# Patient Record
Sex: Male | Born: 1935 | Race: White | Hispanic: No | Marital: Single | Smoking: Former smoker
Health system: Southern US, Community
[De-identification: ages and names within clinical notes are randomized; demographics above are authoritative.]

## PROBLEM LIST (undated history)

## (undated) DIAGNOSIS — Z950 Presence of cardiac pacemaker: Secondary | ICD-10-CM

## (undated) DIAGNOSIS — R0600 Dyspnea, unspecified: Secondary | ICD-10-CM

## (undated) DIAGNOSIS — J449 Chronic obstructive pulmonary disease, unspecified: Secondary | ICD-10-CM

## (undated) HISTORY — PX: INSERT / REPLACE / REMOVE PACEMAKER: SUR710

---

## 2014-07-05 ENCOUNTER — Inpatient Hospital Stay
Admit: 2014-07-05 | Disposition: A | Discharge status: Home / Self Care | Payer: Self-pay | Attending: Internal Medicine | Admitting: Internal Medicine

## 2014-07-05 LAB — COMPREHENSIVE METABOLIC PANEL WITH GFR
Albumin: 3.7 g/dL
Alkaline Phosphatase: 63 U/L
Anion Gap: 11
BUN: 23 mg/dL — ABNORMAL HIGH
Bilirubin,Total: 1.4 mg/dL — ABNORMAL HIGH
Calcium, Total: 8.3 mg/dL — ABNORMAL LOW
Chloride: 97 mmol/L — ABNORMAL LOW
Co2: 25 mmol/L
Creatinine: 1.34 mg/dL — ABNORMAL HIGH
EGFR (African American): 59 — ABNORMAL LOW
EGFR (Non-African Amer.): 51 — ABNORMAL LOW
Glucose: 109 mg/dL — ABNORMAL HIGH
Potassium: 3.5 mmol/L
SGOT(AST): 54 U/L — ABNORMAL HIGH
SGPT (ALT): 32 U/L
Sodium: 133 mmol/L — ABNORMAL LOW
Total Protein: 6.8 g/dL

## 2014-07-05 LAB — TROPONIN I
Troponin-I: 0.17 ng/mL — ABNORMAL HIGH
Troponin-I: 0.15 ng/mL — ABNORMAL HIGH
Troponin-I: 0.13 ng/mL — ABNORMAL HIGH

## 2014-07-05 LAB — CK-MB
CK-MB: 3.6 ng/mL
CK-MB: 3.8 ng/mL
CK-MB: 5.2 ng/mL — ABNORMAL HIGH

## 2014-07-05 LAB — CBC
HCT: 40.2 %
HGB: 13.2 g/dL
MCH: 31.5 pg
MCHC: 32.8 g/dL
MCV: 96 fL
Platelet: 108 x10 3/mm 3 — ABNORMAL LOW
RBC: 4.19 x10 6/mm 3 — ABNORMAL LOW
RDW: 14.6 % — ABNORMAL HIGH
WBC: 9.3 x10 3/mm 3

## 2014-07-05 LAB — PRO B NATRIURETIC PEPTIDE: B-Type Natriuretic Peptide: 415 pg/mL — ABNORMAL HIGH

## 2014-07-06 LAB — CBC WITH DIFFERENTIAL/PLATELET
BASOS ABS: 0 10*3/uL (ref 0.0–0.1)
Basophil %: 0.1 %
Eosinophil #: 0 10*3/uL (ref 0.0–0.7)
Eosinophil %: 0 %
HCT: 41 % (ref 40.0–52.0)
HGB: 13.5 g/dL (ref 13.0–18.0)
Lymphocyte #: 0.3 10*3/uL — ABNORMAL LOW (ref 1.0–3.6)
Lymphocyte %: 2.8 %
MCH: 31.4 pg (ref 26.0–34.0)
MCHC: 32.8 g/dL (ref 32.0–36.0)
MCV: 96 fL (ref 80–100)
MONOS PCT: 3.9 %
Monocyte #: 0.4 x10 3/mm (ref 0.2–1.0)
NEUTROS ABS: 8.6 10*3/uL — AB (ref 1.4–6.5)
Neutrophil %: 93.2 %
Platelet: 107 10*3/uL — ABNORMAL LOW (ref 150–440)
RBC: 4.29 10*6/uL — ABNORMAL LOW (ref 4.40–5.90)
RDW: 14.6 % — AB (ref 11.5–14.5)
WBC: 9.2 10*3/uL (ref 3.8–10.6)

## 2014-07-06 LAB — BASIC METABOLIC PANEL
ANION GAP: 9 (ref 7–16)
BUN: 28 mg/dL — AB
Calcium, Total: 8.4 mg/dL — ABNORMAL LOW
Chloride: 100 mmol/L — ABNORMAL LOW
Co2: 26 mmol/L
Creatinine: 1.03 mg/dL
Glucose: 138 mg/dL — ABNORMAL HIGH
Potassium: 4.3 mmol/L
Sodium: 135 mmol/L

## 2014-07-06 LAB — CLOSTRIDIUM DIFFICILE(ARMC)

## 2014-07-07 LAB — WBCS, STOOL

## 2014-07-09 LAB — STOOL CULTURE

## 2014-07-10 LAB — CULTURE, BLOOD (SINGLE)

## 2014-07-31 NOTE — Discharge Summary (Signed)
PATIENT NAME:  James Le, James Le MR#:  [id number] DATE OF BIRTH:  11-09-1934  DATE OF ADMISSION:  07/05/2014 DATE OF DISCHARGE:  07/07/2014  DISCHARGE DIAGNOSES: 1. Chronic obstructive pulmonary disease exacerbation.  2. Chronic systolic congestive heart failure.  3. Hypertension.  4. Coronary artery disease.  5. Elevated troponin due to  demand ischemia.  6. Acute respiratory failure.   DISCHARGE MEDICATIONS: Please refer to medicine reconciliation.   DISCHARGE INSTRUCTIONS: Two liters home oxygen continuous. Low-sodium diet of regular consistency. Activity as tolerated. Follow up with primary care physician in 1-2 weeks and quit smoking.   IMAGING STUDIES: Chest x-ray PA and lateral, showed chronic changes, nothing acute.   ADMITTING HISTORY AND PHYSICAL: Please see detailed H and P dictated previously. In brief, a 80 year old white male patient with history of COPD, tobacco abuse, presented to the hospital complaining of worsening shortness of breath and cough. He was found to have COPD exacerbation with severe wheezing, admitted to the hospitalist service.   HOSPITAL COURSE: 1. Chronic obstructive pulmonary disease exacerbation with acute respiratory failure. The patient was treated with IV steroids, nebulizers, antibiotics. He improved well during the hospital stay. By the day of discharge, he still had some minimal wheezing, but felt close to baseline. His saturations continued to be low at 84% on exertion, and he has been set up for home oxygen. The patient was counseled to quit smoking prior to discharge.  2. Chronic systolic congestive heart failure, stable during the hospital stay.   Prior to discharge, the patient had mild wheezing on exam. S1, S2 heard. No edema, alert and oriented x 3, and ambulated on his own in the hallway.   TIME SPENT: On day of discharge in discharge activity, was 35 minutes.     ____________________________ Molinda Bailiff Mccormick Macon, MD srs:mw D: 07/11/2014  13:17:11 ET T: 07/11/2014 18:04:27 ET JOB#: [id number]  cc: Wardell Heath R. Grier Vu, MD, <Dictator> Orie Fisherman MD ELECTRONICALLY SIGNED 07/25/2014 10:49

## 2014-07-31 NOTE — H&P (Signed)
PATIENT NAME:  ABBOTT, JASINSKI MR#:  [id number] DATE OF BIRTH:  [birth year]  DATE OF ADMISSION:  07/05/2014  PRIMARY CARE PROVIDER: [facility]  EMERGENCY DEPARTMENT REFERRING PHYSICIAN: Dr. Mayford Knife   CHIEF COMPLAINT: Shortness of breath and cough.   HISTORY OF PRESENT ILLNESS: The patient is a 80 year old white male with history of chronic obstructive pulmonary disease, history of systolic congestive heart failure, hypertension, hyperlipidemia, who states that he has not been feeling well for the past few days. He has had progressive shortness of breath ongoing for the past 4 days. He has also had a productive whitish sputum. He had not noticed any wheezing, but he has had a sore throat. Has not had any fevers, but has been feeling cold. No chills. He also complains of diarrhea for 4 days. Denies any recent hospitalizations or antibiotics. He reports that he has been having few bowel movements per day. No nausea or vomiting, but does have diarrhea. He also complains of chest pain with coughing.   PAST MEDICAL HISTORY:  1. History of chronic obstructive pulmonary disease.  2. History of systolic congestive heart failure.  3. History of AAA repair.  4. Hypertension.  5. Hyperlipidemia.   PAST SURGICAL HISTORY:  1. Status post pacemaker defibrillator placement.  2. Status post AAA repair.   ALLERGIES: TOPROL.   MEDICATIONS AT HOME: Vitamin D3 at 2000 international units daily, Spiriva 18 mcg daily, simvastatin 20 daily, Proventil 2 puffs 4 times a day as needed, potassium chloride 20 mg daily, metoprolol succinate 100 mg 1 tab p.o. b.i.d., lisinopril 10 mg daily, Lasix 40 mg 1 tab p.o. daily, budesonide/formoterol 2 puffs b.i.d., aspirin 81 mg 1 tab p.o. daily, acetaminophen 3 tabs b.i.d.   SOCIAL HISTORY: Smokes less than 1 pack per day. No alcohol or drug use.   FAMILY HISTORY: Positive for hypertension.   REVIEW OF SYSTEMS:  CONSTITUTIONAL: Denies any weight loss weight gain.  HEENT:  Denies any blurred vision. No cataracts. No glaucoma. No nasal congestion. No difficulty swallowing.  CARDIOVASCULAR: Denies any chest pain, history of sick sinus syndrome status post pacemaker and a defibrillator placement. Denies any history of coronary artery disease.  PULMONARY: Complains of cough, shortness of breath.  GASTROINTESTINAL: Denies any nausea, vomiting, diarrhea. No hematemesis. No hematochezia. No hemoptysis.  GENITOURINARY: Denies any frequency, urgency or hesitancy.  SKIN: Denies any rash.  LYMPHATICS: Denies any lymph node enlargement.  MUSCULOSKELETAL: Denies any joint pain or swelling. NEUROLOGIC: Denies any CVA.  PSYCHIATRIC: Denies any anxiety, insomnia.   PHYSICAL EXAMINATION:  VITAL SIGNS: Temperature 98.4, pulse 89, respirations 20, blood pressure 118/61, O2 of 92%.  GENERAL: The patient is a thin Caucasian male in no acute distress.  HEENT: Head atraumatic, normocephalic. Pupils equal and reactive to accommodation. There is no conjunctival pallor. No sclerae icterus. Nasal exam shows no drainage or ulceration.  OROPHARYNX: Clear without any exudate.  NECK: Supple without any thyromegaly.  CARDIOVASCULAR: Regular rate and rhythm. No murmurs, rubs, clicks, or gallops.  LUNGS: He has bilateral wheezing. No accessory muscle usage.  CARDIOVASCULAR: Regular rate and rhythm. No murmurs, rubs, clicks, or gallops.  ABDOMEN: Soft, nontender, nondistended. Positive bowel sounds x4.  EXTREMITIES: No clubbing, cyanosis, or edema.  SKIN: No rash.  LYMPH NODES: Nonpalpable.  VASCULAR: Good DP, PT pulses.  PSYCHIATRIC: Not anxious or depressed.  NEUROLOGICAL: Awake, alert, oriented x3. No focal deficits.   LABORATORY DATA: Glucose 109, BUN 23, creatinine 1.34. Sodium 133, potassium 3.5, chloride 97, CO2 of 25, calcium  8.3. LFTs showed a bilirubin total 1.4, AST 54. Troponin 0.17. WBC 9.3, hemoglobin 13.2, platelet count was 108,000. EKG showed a paced rhythm. Chest x-ray  shows possible Kerley B lines and findings consistent with bilateral interstitial attenuation, favoring possible interstitial pulmonary edema or atypical pneumonia, emphysematous changes.   ASSESSMENT AND PLAN: The patient is a 80 year old white male with history of chronic obstructive pulmonary disease, hypertension, chronic systolic congestive heart failure, who presents with shortness of breath and diarrhea.   1. Shortness of breath, suspected due to acute on chronic obstructive pulmonary disease flare. At this time, we will treat him with nebulizers, intravenous Solu-Medrol and intravenous antibiotics. We will continue Symbicort and Spiriva at home.  2. Chronic systolic congestive heart failure. I will continue his p.o. Lasix. He does not appear to be in fluid overload, especially in light of his diarrhea and his creatinine being elevated. Unknown baseline creatinine. We will monitor his renal function and his respiratory status. 3. Diarrhea. We will rule out Clostridium difficile. We will get stool cultures.  4. Hypertension. Continue metoprolol and lisinopril as taking at home.  5. Hyperlipidemia. Continue simvastatin.  6. Elevated troponin, suspected demand ischemia. Monitor cardiac enzymes. If trending upward, then cardiology consult. Continue aspirin.  7. Miscellaneous: The patient will be on Lovenox for deep vein thrombosis prophylaxis. Monitor his platelet counts, since he already has some chronic thrombocytopenia.    TIME SPENT: 50 minutes.   ____________________________ Lacie Scotts Allena Katz, MD shp:AT D: 07/05/2014 16:13:07 ET T: 07/05/2014 16:35:13 ET JOB#: [id number]  cc: Stephania Macfarlane H. Allena Katz, MD, <Dictator> Charise Carwin MD ELECTRONICALLY SIGNED 07/06/2014 20:21

## 2016-01-06 ENCOUNTER — Inpatient Hospital Stay (HOSPITAL_COMMUNITY)
Admission: EM | Admit: 2016-01-06 | Discharge: 2016-01-11 | DRG: 190 | Disposition: A | Discharge status: Home / Self Care | Payer: Medicare Other | Attending: Internal Medicine | Admitting: Internal Medicine

## 2016-01-06 ENCOUNTER — Emergency Department (HOSPITAL_COMMUNITY): Payer: Medicare Other

## 2016-01-06 DIAGNOSIS — Z720 Tobacco use: Secondary | ICD-10-CM

## 2016-01-06 DIAGNOSIS — Z7951 Long term (current) use of inhaled steroids: Secondary | ICD-10-CM | POA: Diagnosis not present

## 2016-01-06 DIAGNOSIS — T380X5A Adverse effect of glucocorticoids and synthetic analogues, initial encounter: Secondary | ICD-10-CM | POA: Diagnosis present

## 2016-01-06 DIAGNOSIS — D72829 Elevated white blood cell count, unspecified: Secondary | ICD-10-CM | POA: Diagnosis present

## 2016-01-06 DIAGNOSIS — D7589 Other specified diseases of blood and blood-forming organs: Secondary | ICD-10-CM | POA: Diagnosis present

## 2016-01-06 DIAGNOSIS — R739 Hyperglycemia, unspecified: Secondary | ICD-10-CM

## 2016-01-06 DIAGNOSIS — E785 Hyperlipidemia, unspecified: Secondary | ICD-10-CM | POA: Diagnosis present

## 2016-01-06 DIAGNOSIS — I472 Ventricular tachycardia: Secondary | ICD-10-CM | POA: Diagnosis present

## 2016-01-06 DIAGNOSIS — Z950 Presence of cardiac pacemaker: Secondary | ICD-10-CM

## 2016-01-06 DIAGNOSIS — E663 Overweight: Secondary | ICD-10-CM | POA: Diagnosis present

## 2016-01-06 DIAGNOSIS — N179 Acute kidney failure, unspecified: Secondary | ICD-10-CM | POA: Diagnosis present

## 2016-01-06 DIAGNOSIS — Z6826 Body mass index (BMI) 26.0-26.9, adult: Secondary | ICD-10-CM

## 2016-01-06 DIAGNOSIS — Z95810 Presence of automatic (implantable) cardiac defibrillator: Secondary | ICD-10-CM | POA: Diagnosis not present

## 2016-01-06 DIAGNOSIS — J441 Chronic obstructive pulmonary disease with (acute) exacerbation: Secondary | ICD-10-CM | POA: Diagnosis present

## 2016-01-06 DIAGNOSIS — Z87891 Personal history of nicotine dependence: Secondary | ICD-10-CM | POA: Diagnosis not present

## 2016-01-06 DIAGNOSIS — I10 Essential (primary) hypertension: Secondary | ICD-10-CM | POA: Diagnosis present

## 2016-01-06 DIAGNOSIS — Z7952 Long term (current) use of systemic steroids: Secondary | ICD-10-CM | POA: Diagnosis not present

## 2016-01-06 DIAGNOSIS — R0602 Shortness of breath: Secondary | ICD-10-CM | POA: Diagnosis present

## 2016-01-06 DIAGNOSIS — D696 Thrombocytopenia, unspecified: Secondary | ICD-10-CM | POA: Diagnosis present

## 2016-01-06 DIAGNOSIS — J9601 Acute respiratory failure with hypoxia: Secondary | ICD-10-CM

## 2016-01-06 DIAGNOSIS — Z789 Other specified health status: Secondary | ICD-10-CM

## 2016-01-06 HISTORY — DX: Presence of cardiac pacemaker: Z95.0

## 2016-01-06 HISTORY — DX: Chronic obstructive pulmonary disease, unspecified: J44.9

## 2016-01-06 HISTORY — DX: Dyspnea, unspecified: R06.00

## 2016-01-06 LAB — BASIC METABOLIC PANEL
Anion gap: 7 (ref 5–15)
Anion gap: 7 (ref 5–15)
BUN: 16 mg/dL (ref 6–20)
BUN: 15 mg/dL (ref 6–20)
CO2: 30 mmol/L (ref 22–32)
CO2: 32 mmol/L (ref 22–32)
Calcium: 8.8 mg/dL — ABNORMAL LOW (ref 8.9–10.3)
Calcium: 9.4 mg/dL (ref 8.9–10.3)
Chloride: 100 mmol/L — ABNORMAL LOW (ref 101–111)
Chloride: 102 mmol/L (ref 101–111)
Creatinine, Ser: 1.11 mg/dL (ref 0.61–1.24)
Creatinine, Ser: 1.17 mg/dL (ref 0.61–1.24)
GFR calc Af Amer: >60 mL/min (ref >60)
GFR calc Af Amer: >60 mL/min (ref >60)
GFR calc non Af Amer: >60 mL/min (ref >60)
GFR calc non Af Amer: 58 mL/min — ABNORMAL LOW (ref >60)
Glucose, Bld: 129 mg/dL — ABNORMAL HIGH (ref 65–99)
Glucose, Bld: 134 mg/dL — ABNORMAL HIGH (ref 65–99)
Potassium: 7.5 mmol/L (ref 3.5–5.1)
Potassium: 4 mmol/L (ref 3.5–5.1)
Sodium: 137 mmol/L (ref 135–145)
Sodium: 141 mmol/L (ref 135–145)

## 2016-01-06 LAB — MRSA PCR SCREENING: MRSA BY PCR: NEGATIVE

## 2016-01-06 LAB — I-STAT CHEM 8, ED
BUN: 18 mg/dL (ref 6–20)
Calcium, Ion: 1.09 mmol/L — ABNORMAL LOW (ref 1.15–1.40)
Chloride: 99 mmol/L — ABNORMAL LOW (ref 101–111)
Creatinine, Ser: 1.1 mg/dL (ref 0.61–1.24)
Glucose, Bld: 130 mg/dL — ABNORMAL HIGH (ref 65–99)
HCT: 47 % (ref 39.0–52.0)
Hemoglobin: 16 g/dL (ref 13.0–17.0)
Potassium: 3.9 mmol/L (ref 3.5–5.1)
Sodium: 141 mmol/L (ref 135–145)
TCO2: 29 mmol/L (ref 0–100)

## 2016-01-06 LAB — I-STAT TROPONIN, ED: Troponin i, poc: 0.06 ng/mL (ref 0.00–0.08)

## 2016-01-06 LAB — BRAIN NATRIURETIC PEPTIDE: B Natriuretic Peptide: 233.6 pg/mL — ABNORMAL HIGH (ref 0.0–100.0)

## 2016-01-06 LAB — TSH: TSH: 0.51 u[IU]/mL (ref 0.350–4.500)

## 2016-01-06 LAB — CBC
HCT: 45.5 % (ref 39.0–52.0)
Hemoglobin: 14.8 g/dL (ref 13.0–17.0)
MCH: 33.8 pg (ref 26.0–34.0)
MCHC: 32.5 g/dL (ref 30.0–36.0)
MCV: 103.9 fL — ABNORMAL HIGH (ref 78.0–100.0)
Platelets: 144 10*3/uL — ABNORMAL LOW (ref 150–400)
RBC: 4.38 MIL/uL (ref 4.22–5.81)
RDW: 14.7 % (ref 11.5–15.5)
WBC: 16.3 10*3/uL — ABNORMAL HIGH (ref 4.0–10.5)

## 2016-01-06 MED ORDER — LISINOPRIL 10 MG PO TABS
5.0000 mg | ORAL_TABLET | Freq: Every day | ORAL | Status: DC
  Administered 2016-01-07: 5 mg via ORAL
  Filled 2016-01-06: qty 1

## 2016-01-06 MED ORDER — LEVALBUTEROL HCL 0.63 MG/3ML IN NEBU: 0.6300 mg | INHALATION_SOLUTION | Freq: Four times a day (QID) | RESPIRATORY_TRACT | Status: DC | PRN

## 2016-01-06 MED ORDER — ENOXAPARIN SODIUM 40 MG/0.4ML ~~LOC~~ SOLN
40.0000 mg | SUBCUTANEOUS | Status: DC
  Administered 2016-01-06: 40 mg via SUBCUTANEOUS
  Filled 2016-01-06: qty 0.4

## 2016-01-06 MED ORDER — SODIUM CHLORIDE 0.9 % IV SOLN: 250.0000 mL | INTRAVENOUS | Status: DC | PRN

## 2016-01-06 MED ORDER — METHYLPREDNISOLONE SODIUM SUCC 125 MG IJ SOLR
60.0000 mg | Freq: Four times a day (QID) | INTRAMUSCULAR | Status: DC
  Administered 2016-01-06 – 2016-01-07 (×4): 60 mg via INTRAVENOUS
  Filled 2016-01-06 (×3): qty 2

## 2016-01-06 MED ORDER — ALBUTEROL SULFATE (2.5 MG/3ML) 0.083% IN NEBU
5.0000 mg | INHALATION_SOLUTION | Freq: Once | RESPIRATORY_TRACT | Status: AC
  Administered 2016-01-06: 5 mg via RESPIRATORY_TRACT
  Filled 2016-01-06: qty 6

## 2016-01-06 MED ORDER — SIMVASTATIN 20 MG PO TABS
20.0000 mg | ORAL_TABLET | Freq: Every day | ORAL | Status: DC
  Administered 2016-01-06 – 2016-01-10 (×5): 20 mg via ORAL
  Filled 2016-01-06 (×5): qty 1

## 2016-01-06 MED ORDER — SODIUM CHLORIDE 0.9% FLUSH
3.0000 mL | Freq: Two times a day (BID) | INTRAVENOUS | Status: DC
  Administered 2016-01-06 – 2016-01-07 (×2): 3 mL via INTRAVENOUS

## 2016-01-06 MED ORDER — SODIUM CHLORIDE 0.9% FLUSH: 3.0000 mL | INTRAVENOUS | Status: DC | PRN

## 2016-01-06 MED ORDER — DEXTROSE 5 % IV SOLN
500.0000 mg | INTRAVENOUS | Status: DC
  Administered 2016-01-06 – 2016-01-07 (×2): 500 mg via INTRAVENOUS
  Filled 2016-01-06 (×4): qty 500

## 2016-01-06 MED ORDER — IPRATROPIUM-ALBUTEROL 0.5-2.5 (3) MG/3ML IN SOLN
3.0000 mL | Freq: Four times a day (QID) | RESPIRATORY_TRACT | Status: DC
  Administered 2016-01-06 – 2016-01-07 (×3): 3 mL via RESPIRATORY_TRACT
  Filled 2016-01-06 (×4): qty 3

## 2016-01-06 MED ORDER — SODIUM CHLORIDE 0.9% FLUSH
3.0000 mL | Freq: Two times a day (BID) | INTRAVENOUS | Status: DC
  Administered 2016-01-06 – 2016-01-10 (×7): 3 mL via INTRAVENOUS

## 2016-01-06 MED ORDER — METOPROLOL SUCCINATE ER 100 MG PO TB24
100.0000 mg | ORAL_TABLET | Freq: Two times a day (BID) | ORAL | Status: DC
  Administered 2016-01-06 – 2016-01-11 (×10): 100 mg via ORAL
  Filled 2016-01-06 (×5): qty 1
  Filled 2016-01-06: qty 4
  Filled 2016-01-06 (×2): qty 1
  Filled 2016-01-06: qty 4
  Filled 2016-01-06: qty 1

## 2016-01-06 MED ORDER — GUAIFENESIN ER 600 MG PO TB12
600.0000 mg | ORAL_TABLET | Freq: Two times a day (BID) | ORAL | Status: DC
  Administered 2016-01-06 – 2016-01-07 (×2): 600 mg via ORAL
  Filled 2016-01-06 (×2): qty 1

## 2016-01-06 MED ORDER — ADULT MULTIVITAMIN W/MINERALS CH
ORAL_TABLET | Freq: Every day | ORAL | Status: DC
  Administered 2016-01-07 – 2016-01-11 (×5): 1 via ORAL
  Filled 2016-01-06 (×5): qty 1

## 2016-01-06 NOTE — ED Notes (Signed)
Attempted report x1. 

## 2016-01-06 NOTE — ED Notes (Signed)
MD at bedside.Kohut  

## 2016-01-06 NOTE — ED Notes (Signed)
PT placed back onto BiPAP. Pt had labored respirations and SATs 90% on 3L [place].

## 2016-01-06 NOTE — ED Provider Notes (Signed)
[facility]-EMERGENCY DEPT Provider Note   CSN: [id number] Arrival date & time: 01/06/16  1348   By signing my name below, I, Freida Busman, attest that this documentation has been prepared under the direction and in the presence of Raeford Razor, MD . Electronically Signed: Freida Busman, Scribe. 01/06/2016. 2:08 PM.   History   Chief Complaint Chief Complaint  Patient presents with  . Respiratory Distress    The history is provided by the patient and the EMS personnel. No language interpreter was used.     HPI Comments:  James Le is a 81 y.o. male with a history of COPD, who presents to the Emergency Department via EMS, complaining of worsening SOB which began ~ 2200 last night. He states over a 4-5 hour period his breathing gradually worsened. Pt was placed on CPAP en route and given breathing treatments and solumedrol with mild improvement. EMS also placed a 20 gauge IV in the LUE. Pt denies use of home O2. He denies pain at this time and denies acute swelling to his BLE, however, admits he has not taken his Lasix today.   PCP at the [facility]; no pulmonologist   No past medical history on file.  There are no active problems to display for this patient.   No past surgical history on file.     Home Medications    Prior to Admission medications   Not on File    Family History No family history on file.  Social History Social History  Substance Use Topics  . Smoking status: Not on file  . Smokeless tobacco: Not on file  . Alcohol use Not on file     Allergies   Review of patient's allergies indicates not on file.   Review of Systems Review of Systems  Respiratory: Positive for shortness of breath.   Cardiovascular: Negative for chest pain.  Gastrointestinal: Negative for abdominal pain.  Genitourinary: Negative for dysuria.  Musculoskeletal: Negative for back pain and neck pain.  Neurological: Negative for headaches.  All other systems reviewed and are  negative.    Physical Exam Updated Vital Signs BP 137/72 (BP Location: Right Arm)   Pulse 104   Temp (!) 96.5 F (35.8 C) (Axillary)   Resp (!) 34   Ht 5\' 11"  (1.803 m)   Wt 189 lb (85.7 kg)   SpO2 93%   BMI 26.36 kg/m   Physical Exam  Constitutional: He is oriented to person, place, and time. He appears well-developed and well-nourished.  HENT:  Head: Normocephalic and atraumatic.  Eyes: EOM are normal.  Neck: Normal range of motion.  Cardiovascular: Regular rhythm, normal heart sounds and intact distal pulses.  Tachycardia present.   Mildy tachycardic Cardiac device left chest  Pulmonary/Chest: Tachypnea noted.  Course breath sounds bilaterally Not speaking in complete sentence  Abdominal: Soft. He exhibits no distension. There is no tenderness.  Musculoskeletal: Normal range of motion. He exhibits edema.  Mild symmetric LE edema  Neurological: He is alert and oriented to person, place, and time.  Skin: Skin is warm and dry.  Psychiatric: He has a normal mood and affect. Judgment normal.  Nursing note and vitals reviewed.    ED Treatments / Results  DIAGNOSTIC STUDIES:  Oxygen Saturation is 93% on CPAP, adequate by my interpretation.    COORDINATION OF CARE:  2:00 PM Discussed treatment plan with pt at bedside and pt agreed to plan.  Labs (all labs ordered are listed, but only abnormal results are displayed) Labs  Reviewed  BASIC METABOLIC PANEL - Abnormal; Notable for the following:       Result Value   Potassium 7.5 (*)    Chloride 100 (*)    Glucose, Bld 129 (*)    Calcium 8.8 (*)    All other components within normal limits  CBC - Abnormal; Notable for the following:    WBC 16.3 (*)    MCV 103.9 (*)    Platelets 144 (*)    All other components within normal limits  BASIC METABOLIC PANEL - Abnormal; Notable for the following:    Glucose, Bld 134 (*)    GFR calc non Af Amer 58 (*)    All other components within normal limits  BRAIN NATRIURETIC  PEPTIDE - Abnormal; Notable for the following:    B Natriuretic Peptide 233.6 (*)    All other components within normal limits  HEMOGLOBIN A1C - Abnormal; Notable for the following:    Hgb A1c MFr Bld 5.7 (*)    All other components within normal limits  BASIC METABOLIC PANEL - Abnormal; Notable for the following:    Chloride 100 (*)    Glucose, Bld 151 (*)    BUN 21 (*)    Creatinine, Ser 1.32 (*)    GFR calc non Af Amer 50 (*)    GFR calc Af Amer 58 (*)    All other components within normal limits  CBC - Abnormal; Notable for the following:    WBC 12.4 (*)    MCV 105.0 (*)    Platelets 138 (*)    All other components within normal limits  BASIC METABOLIC PANEL - Abnormal; Notable for the following:    Chloride 97 (*)    Glucose, Bld 118 (*)    BUN 37 (*)    GFR calc non Af Amer 60 (*)    All other components within normal limits  CBC - Abnormal; Notable for the following:    WBC 20.5 (*)    MCV 103.6 (*)    Platelets 148 (*)    All other components within normal limits  CBC - Abnormal; Notable for the following:    WBC 20.7 (*)    MCV 104.7 (*)    Platelets 141 (*)    All other components within normal limits  I-STAT CHEM 8, ED - Abnormal; Notable for the following:    Chloride 99 (*)    Glucose, Bld 130 (*)    Calcium, Ion 1.09 (*)    All other components within normal limits  MRSA PCR SCREENING  TSH  VITAMIN B12  FOLATE  MAGNESIUM  I-STAT TROPOININ, ED    EKG  EKG Interpretation None       Radiology No results found.   Dg Chest Portable 1 View  Result Date: 01/06/2016 CLINICAL DATA:  Pt reports SOB onset last night that has been worse today; he reports h/o COPD and pacemaker placement (states he's had 4); former smoker EXAM: PORTABLE CHEST 1 VIEW COMPARISON:  None. FINDINGS: Left anterior chest wall biventricular cardioverter-defibrillator appears well positioned on this single AP view. No pneumothorax.  Lungs are clear.  No pleural effusion. Cardiac  silhouette is normal in size. No mediastinal or hilar masses. IMPRESSION: 1. No acute cardiopulmonary disease. 2. Well-positioned biventricular cardioverter-defibrillator. Electronically Signed   By: Amie Portland M.D.   On: 01/06/2016 14:20    Procedures Procedures (including critical care time)  CRITICAL CARE Performed by: Raeford Razor Total critical care time: 35 minutes Critical  care time was exclusive of separately billable procedures and treating other patients. Critical care was necessary to treat or prevent imminent or life-threatening deterioration. Critical care was time spent personally by me on the following activities: development of treatment plan with patient and/or surrogate as well as nursing, discussions with consultants, evaluation of patient's response to treatment, examination of patient, obtaining history from patient or surrogate, ordering and performing treatments and interventions, ordering and review of laboratory studies, ordering and review of radiographic studies, pulse oximetry and re-evaluation of patient's condition.   Medications Ordered in ED Medications - No data to display   Initial Impression / Assessment and Plan / ED Course  I have reviewed the triage vital signs and the nursing notes.  Pertinent labs & imaging results that were available during my care of the patient were reviewed by me and considered in my medical decision making (see chart for details).  Clinical Course    81 year old male with what is likely COPD exacerbation. Significant respiratory distress on arrival. Transition from CPAP to BiPAP. Further pertinent treatments. Received steroids prehospital. Needs admission for ongoing treatment.  Final Clinical Impressions(s) / ED Diagnoses   Final diagnoses:  COPD exacerbation (HCC)    New Prescriptions New Prescriptions   No medications on file   I personally preformed the services scribed in my presence. The recorded information  has been reviewed is accurate. Raeford RazorStephen Rehema Muffley, MD.     Raeford RazorStephen Corneluis Allston, MD 01/09/16 [id number]

## 2016-01-06 NOTE — ED Notes (Signed)
Pt received 10 mg albuterol and 10 mg Atrovent with 125 soul-medrol en route by [facility] per Paramedic Marvel Plan.

## 2016-01-06 NOTE — H&P (Addendum)
[facility] History and Physical  James Le WRU:[id number] DOB: 06-30-1934 DOA: 01/06/2016  Referring physician: ER PCP: No primary care provider on file.   [facility]  Chief Complaint: doe/sob  HPI: James Le is a 81 y.o. male who typically gets his care in the [place] system presents to our ED secondary to worsening dyspnea on exertion or shortness of breath, productive sputum since Wednesday afternoon. He has a history of COPD, also a history of a pacemaker with AICD. He could not tell me why he had the pacemaker.   Pt is a poor historian.  History of heavy tobacco abuse, 55 yrs total, was smoking 1/2 ppd until he quit just this past Wednesday. Not on home oxygen.  He states he went to see his cardiologist and had the pacemaker evaluated on Wednesday.  He told the [place] that he was getting progressively dyspneic with productive sputum, but  they didn't think much of it and sent him home. He progressively became more dyspneic Weds. Afternoon, and called EMS today. Upon EMS arrival, his sats were in the low 80s on room air and he was transferred to our facility for further evaluation. Since then, he's received nebulizer treatments, steroids, initiallyon CPAP, but now has been weaned down to BiPAP and feeling a little bit better. He is able to speak and give me a full history. His caregiver is sitting here next to him as well and able to give some other history.  He denies  chest pain or palpitations, prior to admission and now during exam, denies syncopal, presyncopal episodes. Denies any subjective fevers or chills, no nausea, vomiting, diarrhea. Bowel movements are normal   Review of Systems:  Per HPI, o/w all systems reviewed and negative.   No past medical history on file. No past surgical history on file. Social History:  has no tobacco, alcohol, and drug history on file.  Smoked 55 years, current 1/2 ppd, "quit" Wednesday.  Allergies  Allergen Reactions  . Metoprolol  Tartrate Rash    No family history on file. reviewed, noncontributory  Prior to Admission medications   Medication Sig Start Date End Date Taking? Authorizing Provider  albuterol (PROVENTIL HFA;VENTOLIN HFA) 108 (90 Base) MCG/ACT inhaler Inhale 2 puffs into the lungs every 6 (six) hours as needed for wheezing.    Yes Historical Provider, MD  albuterol (PROVENTIL) (2.5 MG/3ML) 0.083% nebulizer solution Take 2.5 mg by nebulization every 6 (six) hours as needed for wheezing or shortness of breath.    Yes Historical Provider, MD  budesonide-formoterol (SYMBICORT) 160-4.5 MCG/ACT inhaler Inhale 2 puffs into the lungs 2 (two) times daily.   Yes Historical Provider, MD  furosemide (LASIX) 40 MG tablet Take 40 mg by mouth daily.   Yes Historical Provider, MD  ipratropium (ATROVENT) 0.02 % nebulizer solution Inhale 500 mcg into the lungs.   Yes Historical Provider, MD  lisinopril (PRINIVIL,ZESTRIL) 20 MG tablet Take 5 mg by mouth daily.   Yes Historical Provider, MD  metoprolol succinate (TOPROL-XL) 100 MG 24 hr tablet Take 100 mg by mouth 2 (two) times daily.   Yes Historical Provider, MD  Multiple Vitamins-Minerals (CENTRUM SILVER 50+MEN PO) Take 1 tablet by mouth daily.   Yes Historical Provider, MD  predniSONE (DELTASONE) 5 MG tablet Take 5 mg by mouth daily.   Yes Historical Provider, MD  simvastatin (ZOCOR) 40 MG tablet Take 20 mg by mouth at bedtime.   Yes Historical Provider, MD   Physical Exam: Vitals:   01/06/16  1525 01/06/16 1548 01/06/16 1615 01/06/16 1630  BP:   122/71 122/70  Pulse:  98 95 92  Resp:  26 23 22   Temp:      TempSrc:      SpO2: 97% 92% 98% 97%  Weight:      Height:        Wt Readings from Last 3 Encounters:  01/06/16 85.7 kg (189 lb)    General:  resp distress, on bipap, speaks in full sentences, pleasant,  AAOx3 Eyes: PERRL, normal lids, irises & conjunctiva ENT: grossly normal hearing, lips & tongue, dry mmm Neck: no LAD, masses or  thyromegaly Cardiovascular:  Reg s1 s2, tachycardic Telemetry: tachycardic, paced rhythem  Respiratory: tight, exp wheezing, course breath sounds anteriorly.  Tachypnea. Abdomen: soft, ntnd, no g/r Skin: no rash or induration seen on limited exam Musculoskeletal: grossly normal tone BUE/BLE Psychiatric: grossly normal mood and affect, speech fluent and appropriate Neurologic: grossly non-focal.          Labs on Admission:  Basic Metabolic Panel:  Recent Labs Lab 01/06/16 1355 01/06/16 1454 01/06/16 1503  NA 137 141 141  K 7.5* 4.0 3.9  CL 100* 102 99*  CO2 30 32  --   GLUCOSE 129* 134* 130*  BUN 16 15 18   CREATININE 1.11 1.17 1.10  CALCIUM 8.8* 9.4  --    Liver Function Tests: No results for input(s): AST, ALT, ALKPHOS, BILITOT, PROT, ALBUMIN in the last 168 hours. No results for input(s): LIPASE, AMYLASE in the last 168 hours. No results for input(s): AMMONIA in the last 168 hours. CBC:  Recent Labs Lab 01/06/16 1355 01/06/16 1503  WBC 16.3*  --   HGB 14.8 16.0  HCT 45.5 47.0  MCV 103.9*  --   PLT 144*  --    Cardiac Enzymes: No results for input(s): CKTOTAL, CKMB, CKMBINDEX, TROPONINI in the last 168 hours.  BNP (last 3 results) No results for input(s): BNP in the last 8760 hours.  ProBNP (last 3 results) No results for input(s): PROBNP in the last 8760 hours.  CBG: No results for input(s): GLUCAP in the last 168 hours.  Radiological Exams on Admission: Dg Chest Portable 1 View  Result Date: 01/06/2016 CLINICAL DATA:  Pt reports SOB onset last night that has been worse today; he reports h/o COPD and pacemaker placement (states he's had 4); former smoker EXAM: PORTABLE CHEST 1 VIEW COMPARISON:  None. FINDINGS: Left anterior chest wall biventricular cardioverter-defibrillator appears well positioned on this single AP view. No pneumothorax.  Lungs are clear.  No pleural effusion. Cardiac silhouette is normal in size. No mediastinal or hilar masses.  IMPRESSION: 1. No acute cardiopulmonary disease. 2. Well-positioned biventricular cardioverter-defibrillator. Electronically Signed   By: James Le M.D.   On: 01/06/2016 14:20    EKG: Independently reviewed.   EKG Interpretation  Date/Time:  Saturday January 06 2016 13:57:29 EDT Ventricular Rate:  102 PR Interval:    QRS Duration: 158 QT Interval:  409 QTC Calculation: 533 R Axis:   -94 Text Interpretation:  Sinus tachycardia Ventricular premature complex Right atrial enlargement IVCD Non-specific ST-t changes Confirmed by Juleen China  MD, Jeannett Senior ([id number]) on 01/06/2016 3:06:42 PM        Assessment/Plan Principal Problem:   Acute respiratory failure with hypoxia (HCC) Active Problems:   COPD exacerbation (HCC)   Pacemaker   Tobacco abuse   Paced rhythm on cardiac monitor   Hyperglycemia   1. Acute respiratory failure with hypoxia, likely due to COPD  exacerbation and tobacco abuse history  - admit stepdown, still on bipap  -  slightly improving now has been downgraded from CPAP to BiPAP and  - RT to assist w/ o2 wean, goal sats >92%   2. Copd exacerbation  - see above  - mucinex bid  - emp abx azithromycin  - duonebs scheduled, prn xopenex, iv steroids, wean as able  3. Hx of arythmia, pt not certain which, has ppm /AICD  - paced rhythm, per pt, [facility]   - denies cp currently  4. Htn/hld  - continue bb (no bronchospasms currently), lisinopril, simvastatin  5. Pt is vet, no combat, [facility].  No c/s currently.   Code Status:  full DVT Prophylaxis: lovenox [place] Family Communication: patient's caregiver at bedside. Disposition Plan: inpt tele  Time spent: 45mins  Pete Glatterawn T Rosaria Kubin MD., MBA/MHA [facility] Pager [phone]

## 2016-01-06 NOTE — ED Notes (Signed)
Diet order placed 

## 2016-01-06 NOTE — ED Triage Notes (Signed)
Pt brought in by ems from home for respiratory distress on CPAP. Pt states sob started gradually last night and worsening this am. Pt placed on Bipap on arrival to ED. Pt was 93% on room air with labored breathing.

## 2016-01-06 NOTE — ED Notes (Signed)
Family at bedside. 

## 2016-01-07 DIAGNOSIS — J9601 Acute respiratory failure with hypoxia: Secondary | ICD-10-CM

## 2016-01-07 LAB — CBC
HCT: 46.1 % (ref 39.0–52.0)
HEMOGLOBIN: 14.7 g/dL (ref 13.0–17.0)
MCH: 33.5 pg (ref 26.0–34.0)
MCHC: 31.9 g/dL (ref 30.0–36.0)
MCV: 105 fL — ABNORMAL HIGH (ref 78.0–100.0)
Platelets: 138 10*3/uL — ABNORMAL LOW (ref 150–400)
RBC: 4.39 MIL/uL (ref 4.22–5.81)
RDW: 14.6 % (ref 11.5–15.5)
WBC: 12.4 10*3/uL — ABNORMAL HIGH (ref 4.0–10.5)

## 2016-01-07 LAB — BASIC METABOLIC PANEL
ANION GAP: 11 (ref 5–15)
BUN: 21 mg/dL — ABNORMAL HIGH (ref 6–20)
CO2: 28 mmol/L (ref 22–32)
Calcium: 9.3 mg/dL (ref 8.9–10.3)
Chloride: 100 mmol/L — ABNORMAL LOW (ref 101–111)
Creatinine, Ser: 1.32 mg/dL — ABNORMAL HIGH (ref 0.61–1.24)
GFR calc Af Amer: 58 mL/min — ABNORMAL LOW (ref >60)
GFR calc non Af Amer: 50 mL/min — ABNORMAL LOW (ref >60)
GLUCOSE: 151 mg/dL — AB (ref 65–99)
POTASSIUM: 4.1 mmol/L (ref 3.5–5.1)
Sodium: 139 mmol/L (ref 135–145)

## 2016-01-07 LAB — HEMOGLOBIN A1C
HEMOGLOBIN A1C: 5.7 % — AB (ref 4.8–5.6)
Mean Plasma Glucose: 117 mg/dL

## 2016-01-07 MED ORDER — BUDESONIDE 0.25 MG/2ML IN SUSP
0.2500 mg | Freq: Two times a day (BID) | RESPIRATORY_TRACT | Status: DC
  Administered 2016-01-07 – 2016-01-10 (×6): 0.25 mg via RESPIRATORY_TRACT
  Filled 2016-01-07 (×6): qty 2

## 2016-01-07 MED ORDER — ALBUTEROL SULFATE (2.5 MG/3ML) 0.083% IN NEBU
2.5000 mg | INHALATION_SOLUTION | RESPIRATORY_TRACT | Status: DC | PRN
  Administered 2016-01-08 – 2016-01-11 (×5): 2.5 mg via RESPIRATORY_TRACT
  Filled 2016-01-07 (×5): qty 3

## 2016-01-07 MED ORDER — IPRATROPIUM-ALBUTEROL 0.5-2.5 (3) MG/3ML IN SOLN
3.0000 mL | Freq: Four times a day (QID) | RESPIRATORY_TRACT | Status: DC
  Administered 2016-01-07 – 2016-01-11 (×15): 3 mL via RESPIRATORY_TRACT
  Filled 2016-01-07 (×15): qty 3

## 2016-01-07 MED ORDER — GUAIFENESIN ER 600 MG PO TB12
1200.0000 mg | ORAL_TABLET | Freq: Two times a day (BID) | ORAL | Status: DC
  Administered 2016-01-07 – 2016-01-08 (×3): 1200 mg via ORAL
  Filled 2016-01-07 (×3): qty 2

## 2016-01-07 MED ORDER — SODIUM CHLORIDE 0.9 % IV SOLN
INTRAVENOUS | Status: DC
  Administered 2016-01-07: 15:00:00 via INTRAVENOUS

## 2016-01-07 MED ORDER — METHYLPREDNISOLONE SODIUM SUCC 125 MG IJ SOLR
60.0000 mg | Freq: Two times a day (BID) | INTRAMUSCULAR | Status: DC
  Administered 2016-01-07 – 2016-01-08 (×3): 60 mg via INTRAVENOUS
  Filled 2016-01-07 (×3): qty 2

## 2016-01-07 MED ORDER — ENOXAPARIN SODIUM 40 MG/0.4ML ~~LOC~~ SOLN
40.0000 mg | SUBCUTANEOUS | Status: DC
  Administered 2016-01-07 – 2016-01-10 (×4): 40 mg via SUBCUTANEOUS
  Filled 2016-01-07 (×4): qty 0.4

## 2016-01-07 MED ORDER — ALBUTEROL SULFATE (2.5 MG/3ML) 0.083% IN NEBU: 5.0000 mg | INHALATION_SOLUTION | Freq: Once | RESPIRATORY_TRACT | Status: DC

## 2016-01-07 NOTE — Evaluation (Signed)
Physical Therapy Evaluation Patient Details Name: James Le MRN: [id number] DOB: [birth year] Today's Date: 01/07/2016   History of Present Illness  Patient is a 81 yo male admitted 01/06/16 with worsening dyspnea and productive cough.  Patient with acute resp failure with hypoxia (O2 sats in low 80's on room air).  Patient on BiPAP, now weaned to 4L O2 via .   PMH:  COPD, pacemaker, tobacco use, HTN, HLD    Clinical Impression  Patient presents with problems listed below.  Will benefit from acute PT to maximize functional independence prior to return home with girlfriend/caregiver.  Patient limited by DOE and increased HR.  Should progress well with mobility.  Do not anticipate any f/u PT needs at d/c.    Follow Up Recommendations No PT follow up;Supervision for mobility/OOB    Equipment Recommendations  None recommended by PT    Recommendations for Other Services       Precautions / Restrictions Precautions Precautions: Fall Restrictions Weight Bearing Restrictions: No      Mobility  Bed Mobility Overal bed mobility: Needs Assistance Bed Mobility: Supine to Sit     Supine to sit: Supervision     General bed mobility comments: Safety only.  Good sitting balance once upright.  Transfers Overall transfer level: Needs assistance Equipment used: None Transfers: Sit to/from UGI CorporationStand;Stand Pivot Transfers Sit to Stand: Min guard Stand pivot transfers: Min guard       General transfer comment: Min guard for safety.  Patient slightly unsteady initially upon stance.  Improved with time.  Able to transfer to chair with min guard assist.  HR increased to 104 (from 89) during mobility.  O2 sats remained at/above 92% on 4L O2.  Patient with DOE of 3/4.  Ambulation/Gait             General Gait Details: NT  Stairs            Wheelchair Mobility    Modified Rankin (Stroke Patients Only)       Balance Overall balance assessment: Needs assistance          Standing balance support: No upper extremity supported;During functional activity Standing balance-Leahy Scale: Good                               Pertinent Vitals/Pain Pain Assessment: No/denies pain    Home Living Family/patient expects to be discharged to:: Private residence Living Arrangements: Spouse/significant other (girlfriend/caregiver) Available Help at Discharge: Friend(s);Available 24 hours/day Type of Home: House Home Access: Stairs to enter Entrance Stairs-Rails: Doctor, general practiceight;Left Entrance Stairs-Number of Steps: 4 Home Layout: One level Home Equipment: Walker - 2 wheels;Cane - single point;Shower seat;Bedside commode;Wheelchair - IT trainermanual;Electric scooter      Prior Function Level of Independence: Independent;Needs assistance   Gait / Transfers Assistance Needed: Patient uses cane for longer distances  ADL's / Homemaking Assistance Needed: Girlfriend prepares meals and assists with housekeeping.  Comments: Patient mowing yard on riding mower.     Hand Dominance        Extremity/Trunk Assessment   Upper Extremity Assessment: Overall WFL for tasks assessed           Lower Extremity Assessment: Generalized weakness         Communication   Communication: No difficulties  Cognition Arousal/Alertness: Awake/alert Behavior During Therapy: WFL for tasks assessed/performed Overall Cognitive Status: Within Functional Limits for tasks assessed  General Comments      Exercises     Assessment/Plan    PT Assessment Patient needs continued PT services  PT Problem List Decreased strength;Decreased activity tolerance;Decreased balance;Decreased mobility;Cardiopulmonary status limiting activity          PT Treatment Interventions Gait training;DME instruction;Stair training;Functional mobility training;Patient/family education;Therapeutic exercise    PT Goals (Current goals can be found in the Care Plan section)   Acute Rehab PT Goals Patient Stated Goal: To be able to return home PT Goal Formulation: With patient Time For Goal Achievement: 01/14/16 Potential to Achieve Goals: Good    Frequency Min 3X/week   Barriers to discharge        Co-evaluation               End of Session Equipment Utilized During Treatment: Gait belt;Oxygen Activity Tolerance: Patient limited by fatigue (DOE) Patient left: in chair;with call bell/phone within reach (Resp Therapist in room) Nurse Communication: Mobility status (In chair)         Time: 6015-6153 PT Time Calculation (min) (ACUTE ONLY): 18 min   Charges:   PT Evaluation $PT Eval High Complexity: 1 Procedure     PT G CodesVena Austria 2016/01/08, 9:06 AM Durenda Hurt. Renaldo Fiddler, [facility] Acute Rehab Services Pager [phone]

## 2016-01-07 NOTE — Progress Notes (Signed)
Received page from CCMD regarding 5 beat run of VT. Primary MD paged. Pt asymptomatic, denies needs.   Leonidas Romberg, RN

## 2016-01-07 NOTE — Progress Notes (Signed)
CSW received c/s for COPD Gold Protocol.  Pt has had one admission in 6 months with no ED visits.  PT has no f/u recommendations and no other CSW needs identified.  CSW signing off, please re-consult as necessary.  Pollyann Savoy, LCSW Weekend Coverage [phone]

## 2016-01-07 NOTE — Progress Notes (Signed)
Pt received from [facility] RN. Pt and family member oriented to room and equipment. VSS. Telemetry applied, CCMD notified. Respiratory paged for PRN treatment and flutter valve. Pt complains of congested cough, but unable to cough anything up. Family at bedside, call light within reach, will continue to monitor.   Leonidas Romberg, RN

## 2016-01-07 NOTE — Progress Notes (Signed)
[place] TEAM 1 - Stepdown/ICU TEAM  Gigi Gindward Frank Ducat  ZOX:[id number] DOB: 11/20/1934 DOA: 01/06/2016 PCP: PROVIDER NOT IN SYSTEM    Brief Narrative:  81 y.o. [place] to our ED secondary to worsening dyspnea on exertion and productive sputum for 2-3 days. He has a history of COPD (not on O2).  Upon EMS arrival his sats were in the low 80s on room air.   Subjective: The patient is resting comfortably in a bedside chair.  He reports that his breathing has improved significantly.  He denies chest pain active shortness of breath nausea vomiting or abdominal pain.  He reports that he still feels that he suffered with a significant amount of airway congestion and has not yet been able to expectorate any of this to a significant extent.  Assessment & Plan:  Acute respiratory failure with hypoxia due to COPD exacerbation Did not require BiPAP last night - slowly improving though wheezing persists - continue usual medical management to include steroids and empiric antibiotic therapy - wean oxygen as able - counseled on absolute need to discontinue smoking - will need ambulatory oxygen saturation evaluation prior to discharge  Acute kidney injury Creatinine on the upward trend - likely simple prerenal state due to poor intake in setting of respiratory distress - gently hydrate and follow  Hx of arythmia > PPM /AICD Appears stable on telemetry  HTN Blood pressure currently well controlled  HLD Continue usual home medical therapy  Macrocytosis Denies heavy alcohol abuse - check B-12 and folic acid levels   DVT prophylaxis: lovenox  Code Status: FULL CODE Family Communication: Spoke with wife at bedside Disposition Plan: Stable for transfer to telemetry bed  Consultants:  none  Procedures: none  Antimicrobials:  Azithromycin 10/7 >  Objective: Blood pressure 106/86, pulse 93, temperature 97.8 F (36.6 C), temperature source Oral,  resp. rate 17, height 5\' 11"  (1.803 m), weight 85.7 kg (189 lb), SpO2 94 %.  Intake/Output Summary (Last 24 hours) at 01/07/16 1131 Last data filed at 01/07/16 0900  Gross per 24 hour  Intake              250 ml  Output              350 ml  Net             -100 ml   Filed Weights   01/06/16 1351  Weight: 85.7 kg (189 lb)    Examination: General: No acute respiratory distress Lungs: Diffuse expiratory wheeze with no prolonged expiratory phase with good air movement throughout all fields otherwise with no focal crackles Cardiovascular: Regular rate and rhythm without murmur gallop or rub normal S1 and S2 Abdomen: Nontender, overweight, soft, bowel sounds positive, no rebound, no ascites, no appreciable mass Extremities: No significant cyanosis, clubbing, or edema bilateral lower extremities  CBC:  Recent Labs Lab 01/06/16 1355 01/06/16 1503 01/07/16 0235  WBC 16.3*  --  12.4*  HGB 14.8 16.0 14.7  HCT 45.5 47.0 46.1  MCV 103.9*  --  105.0*  PLT 144*  --  138*   Basic Metabolic Panel:  Recent Labs Lab 01/06/16 1355 01/06/16 1454 01/06/16 1503 01/07/16 0235  NA 137 141 141 139  K 7.5* 4.0 3.9 4.1  CL 100* 102 99* 100*  CO2 30 32  --  28  GLUCOSE 129* 134* 130* 151*  BUN 16 15 18  21*  CREATININE 1.11 1.17 1.10 1.32*  CALCIUM  8.8* 9.4  --  9.3   GFR: Estimated Creatinine Clearance: 49.1 mL/min (by C-G formula based on SCr of 1.32 mg/dL (H)).  Liver Function Tests: No results for input(s): AST, ALT, ALKPHOS, BILITOT, PROT, ALBUMIN in the last 168 hours. No results for input(s): LIPASE, AMYLASE in the last 168 hours. No results for input(s): AMMONIA in the last 168 hours.  Coagulation Profile: No results for input(s): INR, PROTIME in the last 168 hours.  Cardiac Enzymes: No results for input(s): CKTOTAL, CKMB, CKMBINDEX, TROPONINI in the last 168 hours.  HbA1C: No results found for: HGBA1C  CBG: No results for input(s): GLUCAP in the last 168  hours.  Recent Results (from the past 240 hour(s))  MRSA PCR Screening     Status: None   Collection Time: 01/06/16  7:43 PM  Result Value Ref Range Status   MRSA by PCR NEGATIVE NEGATIVE Final    Comment:        The GeneXpert MRSA Assay (FDA approved for NASAL specimens only), is one component of a comprehensive MRSA colonization surveillance program. It is not intended to diagnose MRSA infection nor to guide or monitor treatment for MRSA infections.      Scheduled Meds: . azithromycin  500 mg Intravenous Q24H  . enoxaparin (LOVENOX) injection  40 mg Subcutaneous Q24H  . guaiFENesin  600 mg Oral BID  . ipratropium-albuterol  3 mL Nebulization Q6H  . lisinopril  5 mg Oral Daily  . methylPREDNISolone (SOLU-MEDROL) injection  60 mg Intravenous Q6H  . metoprolol succinate  100 mg Oral BID  . multivitamin with minerals   Oral Daily  . simvastatin  20 mg Oral QHS  . sodium chloride flush  3 mL Intravenous Q12H  . sodium chloride flush  3 mL Intravenous Q12H     LOS: 1 day   Lonia Blood, MD [facility] Office  [phone] Pager - Text Page per Loretha Stapler as per below:  On-Call/Text Page:      [email or web address]      password [email or web address]  If 7PM-7AM, please contact night-coverage [email or web address] Password [email or web address] 01/07/2016, 11:31 AM

## 2016-01-07 NOTE — Progress Notes (Addendum)
RT instructed pt and family on the use of flutter valve. Pt able to demonstrate back good technique.  Pt did have a productive cough following the use of the flutter valve - produced small white secretions by spontaneous cough.

## 2016-01-08 DIAGNOSIS — I472 Ventricular tachycardia: Secondary | ICD-10-CM

## 2016-01-08 DIAGNOSIS — J441 Chronic obstructive pulmonary disease with (acute) exacerbation: Principal | ICD-10-CM

## 2016-01-08 DIAGNOSIS — Z720 Tobacco use: Secondary | ICD-10-CM

## 2016-01-08 LAB — VITAMIN B12: Vitamin B-12: 247 pg/mL (ref 180–914)

## 2016-01-08 LAB — BASIC METABOLIC PANEL
ANION GAP: 10 (ref 5–15)
BUN: 37 mg/dL — ABNORMAL HIGH (ref 6–20)
CALCIUM: 9.3 mg/dL (ref 8.9–10.3)
CO2: 29 mmol/L (ref 22–32)
CREATININE: 1.14 mg/dL (ref 0.61–1.24)
Chloride: 97 mmol/L — ABNORMAL LOW (ref 101–111)
GFR, EST NON AFRICAN AMERICAN: 60 mL/min — AB (ref >60)
Glucose, Bld: 118 mg/dL — ABNORMAL HIGH (ref 65–99)
Potassium: 4.3 mmol/L (ref 3.5–5.1)
SODIUM: 136 mmol/L (ref 135–145)

## 2016-01-08 LAB — CBC
HCT: 45.9 % (ref 39.0–52.0)
HEMOGLOBIN: 14.8 g/dL (ref 13.0–17.0)
MCH: 33.4 pg (ref 26.0–34.0)
MCHC: 32.2 g/dL (ref 30.0–36.0)
MCV: 103.6 fL — ABNORMAL HIGH (ref 78.0–100.0)
PLATELETS: 148 10*3/uL — AB (ref 150–400)
RBC: 4.43 MIL/uL (ref 4.22–5.81)
RDW: 14.6 % (ref 11.5–15.5)
WBC: 20.5 10*3/uL — AB (ref 4.0–10.5)

## 2016-01-08 LAB — MAGNESIUM: MAGNESIUM: 2.1 mg/dL (ref 1.7–2.4)

## 2016-01-08 LAB — FOLATE: FOLATE: 10.7 ng/mL (ref >5.9)

## 2016-01-08 MED ORDER — CALCIUM CARBONATE ANTACID 500 MG PO CHEW
400.0000 mg | CHEWABLE_TABLET | Freq: Three times a day (TID) | ORAL | Status: DC | PRN
  Administered 2016-01-08: 400 mg via ORAL
  Filled 2016-01-08 (×2): qty 2

## 2016-01-08 MED ORDER — AZITHROMYCIN 500 MG PO TABS
500.0000 mg | ORAL_TABLET | Freq: Every day | ORAL | Status: DC
Start: 2016-01-08 — End: 2016-01-11
  Administered 2016-01-08 – 2016-01-11 (×4): 500 mg via ORAL
  Filled 2016-01-08 (×4): qty 1

## 2016-01-08 NOTE — Progress Notes (Deleted)
Patient on BSC, had a BM, while transferring back to bed, had a vagal episode. Latoya, CNA and I got him to chair. He was unresponsive, HR dropped to 40s, called for help, we lifted him back to bed from chair. Vitals WNL. Patient came to back in bed. He is now alert and oriented, knows what happens, stated it happens all the time. Paged MD, will continue to montior.  Minerva Ends

## 2016-01-08 NOTE — Progress Notes (Signed)
Per [employer], pt had 13 run of V tach. Paged [facility] to make aware. Pt was sleeping, asymptomatic

## 2016-01-08 NOTE — Progress Notes (Addendum)
James Le  ZOX:[id number] DOB: [birth year] DOGigi Gin: 01/06/2016 PCP: PROVIDER NOT IN SYSTEM    Brief Narrative:  81 [facility].o. male [facility] [place] presented to our ED secondary to worsening dyspnea on exertion and productive sputum for 2-3 days. He has a history of COPD (not on O2).  Upon EMS arrival his sats were in the low 80s on room air. He claims to have quit smoking 1.5 weeks prior to this admission. History of HTN. Slowly improving. Possible discharge in the next 24 hours (patient may insist on leaving). Will need evaluation for home O2 needs prior to discharge.  Subjective: States that he has gradually made improvement since admission. Cough with intermittent white sputum-bringing up more after flutter valve. Intermittent wheezing. Anxious to get back home.  Assessment & Plan:  Acute respiratory failure with hypoxia due to COPD exacerbation Did not require BiPAP last night - slowly improving though wheezing persists - continue usual medical management to include steroids and empiric antibiotic therapy - wean oxygen as able - counseled on absolute need to discontinue smoking - will need ambulatory oxygen saturation evaluation prior to discharge - States that he quit smoking 1.5 weeks prior to this admission.  Acute kidney injury Resolved. DC IV fluids. Increased ad lib. oral liquids and diet.  Hx of arythmia > PPM /AICD Patient having intermittent NSVT. On 01/08/16, Requested [facility] to check device and consult if needed. Continue telemetry. Potassium and magnesium are appropriate.  HTN Blood pressure currently well controlled. On metoprolol. Lisinopril currently on hold.   HLD Continue usual home medical therapy  Macrocytosis Denies heavy alcohol abuse - check B-12 (247-low normal) and folic acid levels. May consider B12 supplements at discharge.  Thrombocytopenia - Appears chronic upon reviewing lab results from April 2016. Stable.  Tobacco abuse -  Cessation counseled.   DVT prophylaxis: lovenox  Code Status: FULL CODE Family Communication: Spoke with wife at bedside Disposition Plan: Transferred from stepdown to telemetry 10/8. Possible DC home 10/10.  Consultants:  none  Procedures: none  Antimicrobials:  Azithromycin 10/7 >  Objective:  Vitals:   01/08/16 0917 01/08/16 0940 01/08/16 0943 01/08/16 0944  BP:  116/67    Pulse:  94    Resp:      Temp:      TempSrc:      SpO2: 95%  (!) 89% 91%  Weight:      Height:      Temperature 97.17F, respiratory rate 18/m   Intake/Output Summary (Last 24 hours) at 01/08/16 1333 Last data filed at 01/08/16 0745  Gross per 24 hour  Intake              480 ml  Output             1620 ml  Net            -1140 ml   Filed Weights   01/06/16 1351  Weight: 85.7 kg (189 lb)    Examination: General: Pleasant elderly male, sitting up in chair. Mild tachypnea with purse lipped breathing. Lungs: Reduced breath sounds bilaterally with prolonged expiration and scattered few medium pitched expiratory rhonchi but no crackles. Mildly tachypneic and worsening breathing. Cardiovascular: Regular rate and rhythm without murmur gallop or rub normal S1 and S2. No JVD or pedal edema. Telemetry: V paced rhythm. Noted up to 3 episodes of NSVT up to 13 bpm. Abdomen: Nontender, overweight, soft, bowel sounds positive, no rebound, no ascites, no  appreciable mass Extremities: No significant cyanosis, clubbing, or edema bilateral lower extremities CNS: Alert and oriented. No focal neurological deficits.  CBC:  Recent Labs Lab 01/06/16 1355 01/06/16 1503 01/07/16 0235 01/08/16 0140  WBC 16.3*  --  12.4* 20.5*  HGB 14.8 16.0 14.7 14.8  HCT 45.5 47.0 46.1 45.9  MCV 103.9*  --  105.0* 103.6*  PLT 144*  --  138* 148*   Basic Metabolic Panel:  Recent Labs Lab 01/06/16 1355 01/06/16 1454 01/06/16 1503 01/07/16 0235 01/08/16 0140  NA 137 141 141 139 136  K 7.5* 4.0 3.9 4.1 4.3  CL 100*  102 99* 100* 97*  CO2 30 32  --  28 29  GLUCOSE 129* 134* 130* 151* 118*  BUN 16 15 18  21* 37*  CREATININE 1.11 1.17 1.10 1.32* 1.14  CALCIUM 8.8* 9.4  --  9.3 9.3  MG  --   --   --   --  2.1   GFR: Estimated Creatinine Clearance: 56.9 mL/min (by C-G formula based on SCr of 1.14 mg/dL).  Liver Function Tests: No results for input(s): AST, ALT, ALKPHOS, BILITOT, PROT, ALBUMIN in the last 168 hours. No results for input(s): LIPASE, AMYLASE in the last 168 hours. No results for input(s): AMMONIA in the last 168 hours.  Coagulation Profile: No results for input(s): INR, PROTIME in the last 168 hours.  Cardiac Enzymes: No results for input(s): CKTOTAL, CKMB, CKMBINDEX, TROPONINI in the last 168 hours.  HbA1C: Hgb A1c MFr Bld  Date/Time Value Ref Range Status  01/06/2016 07:04 PM 5.7 (H) 4.8 - 5.6 % Final    Comment:    (NOTE)         Pre-diabetes: 5.7 - 6.4         Diabetes: >6.4         Glycemic control for adults with diabetes: <7.0     CBG: No results for input(s): GLUCAP in the last 168 hours.  Recent Results (from the past 240 hour(s))  MRSA PCR Screening     Status: None   Collection Time: 01/06/16  7:43 PM  Result Value Ref Range Status   MRSA by PCR NEGATIVE NEGATIVE Final    Comment:        The GeneXpert MRSA Assay (FDA approved for NASAL specimens only), is one component of a comprehensive MRSA colonization surveillance program. It is not intended to diagnose MRSA infection nor to guide or monitor treatment for MRSA infections.      Scheduled Meds: . azithromycin  500 mg Intravenous Q24H  . budesonide (PULMICORT) nebulizer solution  0.25 mg Nebulization BID  . enoxaparin (LOVENOX) injection  40 mg Subcutaneous Q24H  . guaiFENesin  1,200 mg Oral BID  . ipratropium-albuterol  3 mL Nebulization QID  . methylPREDNISolone (SOLU-MEDROL) injection  60 mg Intravenous Q12H  . metoprolol succinate  100 mg Oral BID  . multivitamin with minerals   Oral Daily    . simvastatin  20 mg Oral QHS  . sodium chloride flush  3 mL Intravenous Q12H     LOS: 2 days   Chanteria Haggard, MD, FACP, FHM. [facility] Pager [phone]  If 7PM-7AM, please contact night-coverage [email or web address] Password [email or web address] 01/08/2016, 1:45 PM

## 2016-01-08 NOTE — Evaluation (Signed)
Occupational Therapy Evaluation and Discharge Patient Details Name: James Le Frank Squitieri MRN: [id number] DOB: January 24, 1935 Today's Date: 01/08/2016    History of Present Illness Patient is a 81 yo male admitted 01/06/16 with worsening dyspnea and productive cough.  Patient with acute resp failure with hypoxia (O2 sats in low 80's on room air).  Patient on BiPAP, now weaned to 4L O2 via [place].   PMH:  COPD, pacemaker, tobacco use, HTN, HLD   Clinical Impression   This 81 yo male admitted with above presents to acute OT at a Mod I level for basic ADLs. O2 on RA was monitored for 50 feet of ambulation and activities on room  (the lowest it dropped was 88% and the highest was 97%), HR at high was 90. No further OT needs, we will sign off.    Follow Up Recommendations  No OT follow up    Equipment Recommendations  None recommended by OT       Precautions / Restrictions Restrictions Weight Bearing Restrictions: No      Mobility Bed Mobility               General bed mobility comments: Pt up in recliner upon arrival  Transfers Overall transfer level: Needs assistance Equipment used: None Transfers: Sit to/from Stand Sit to Stand: Modified independent (Device/Increase time)                   ADL Overall ADL's : Modified independent                                       General ADL Comments: Increased time due to DOE--pt stated, I know when to rest. I either push a cart at the store or ride one of the motorized carts (depending on how I feel). I explained to him how I thought it would be a good idea to use a seat in the shower so he would not get so winded while showering and he reported I get in and get out, I don't stay there long. I educated pt on purse lipped breathing and he reported he was aware of how to do this.                Pertinent Vitals/Pain Pain Assessment: No/denies pain     Hand Dominance Right   Extremity/Trunk Assessment Upper  Extremity Assessment Upper Extremity Assessment: Overall WFL for tasks assessed           Communication Communication Communication: No difficulties   Cognition Arousal/Alertness: Awake/alert Behavior During Therapy: WFL for tasks assessed/performed Overall Cognitive Status: Within Functional Limits for tasks assessed                                Home Living Family/patient expects to be discharged to:: Private residence Living Arrangements: Spouse/significant other (girlfriend) Available Help at Discharge: Available 24 hours/day;Family Type of Home: House Home Access: Stairs to enter [employer] CorporationEntrance Stairs-Number of Steps: 4 Entrance Stairs-Rails: Right;Left Home Layout: One level     Bathroom Shower/Tub: Walk-in Pensions consultantshower;Curtain   Bathroom Toilet: Standard     Home Equipment: Environmental consultantWalker - 2 wheels;Cane - single point;Shower seat;Bedside commode;Wheelchair - IT trainermanual;Electric scooter          Prior Functioning/Environment Level of Independence: Independent;Needs assistance  Gait / Transfers Assistance Needed: Patient uses cane for longer distances ADL's /  Homemaking Assistance Needed: Girlfriend prepares meals and assists with housekeeping.   Comments: Patient mowing yard on riding mower.              OT Goals(Current goals can be found in the care plan section) Acute Rehab OT Goals Patient Stated Goal: to go home and get back to the normal every day things I do  OT Frequency:                End of Session Equipment Utilized During Treatment:  (none) Nurse Communication: Mobility status (pt asking about getting his medical records from this stay to take to [facility] next week)  Activity Tolerance: Patient tolerated treatment well Patient left: in chair;with call bell/phone within reach   Time: 0731-0749 OT Time Calculation (min): 18 min Charges:  OT General Charges $OT Visit: 1 Procedure OT Evaluation $OT Eval Moderate Complexity: 1  Procedure  Evette Georges [phone] 01/08/2016, 7:59 AM

## 2016-01-08 NOTE — Care Management Note (Signed)
Case Management Note Donn Pierini RN, BSN Unit 2W-Case Manager [phone]  Patient Details  Name: James Le MRN: [id number] Date of Birth: [birth year]  Subjective/Objective:  Pt admitted with acute respiratory failure/COPD                  Action/Plan: PTA pt lived at home with SO- referral received for [facility] needs- per conversation with pt at the bedside- pt has PCP at the [facility] in the [facility]- pt gets his meds via Mail order via the [place]. Pt can not remember his PCP-name- phone# to clinic is [phone]- as it is a federal holiday today- unable to reach the [place] to notify of pt's admission- will call the [facility] tomorrow to speak with them regarding pt's admission and any discharge needs- pt may need home 02 and would like this arranged through the [place] if needed. CM to continue to follow.    Expected Discharge Date:                  Expected Discharge Plan:  Home/Self Care  In-House Referral:     Discharge planning Services  CM Consult  Post Acute Care Choice:  Durable Medical Equipment Choice offered to:     DME Arranged:    DME Agency:     HH Arranged:    HH Agency:     Status of Service:  In process, will continue to follow  If discussed at Long Length of Stay Meetings, dates discussed:    Additional Comments:  Darrold Span, RN 01/08/2016, 2:05 PM

## 2016-01-09 LAB — CBC
HEMATOCRIT: 44.3 % (ref 39.0–52.0)
HEMOGLOBIN: 13.7 g/dL (ref 13.0–17.0)
MCH: 32.4 pg (ref 26.0–34.0)
MCHC: 30.9 g/dL (ref 30.0–36.0)
MCV: 104.7 fL — ABNORMAL HIGH (ref 78.0–100.0)
Platelets: 141 10*3/uL — ABNORMAL LOW (ref 150–400)
RBC: 4.23 MIL/uL (ref 4.22–5.81)
RDW: 14.6 % (ref 11.5–15.5)
WBC: 20.7 10*3/uL — ABNORMAL HIGH (ref 4.0–10.5)

## 2016-01-09 MED ORDER — METHYLPREDNISOLONE SODIUM SUCC 125 MG IJ SOLR
60.0000 mg | Freq: Three times a day (TID) | INTRAMUSCULAR | Status: DC
  Administered 2016-01-09 – 2016-01-11 (×6): 60 mg via INTRAVENOUS
  Filled 2016-01-09 (×6): qty 2

## 2016-01-09 MED ORDER — METHYLPREDNISOLONE SODIUM SUCC 125 MG IJ SOLR: 60.0000 mg | Freq: Three times a day (TID) | INTRAMUSCULAR | Status: DC

## 2016-01-09 NOTE — Progress Notes (Addendum)
James Le  FKC:[id number] DOB: 1934-09-17 DOA: 01/06/2016 PCP: PROVIDER NOT IN SYSTEM    Brief Narrative:  81 y.o. male who gets his care in the [facility] [place] system presented to our ED secondary to worsening dyspnea on exertion and productive sputum for 2-3 days. He has a history of COPD (not on O2).  Upon EMS arrival his sats were in the low 80s on room air. He claims to have quit smoking 1.5 weeks prior to this admission. History of HTN. Slowly improving. Possible discharge in the next 24 hours (patient may insist on leaving). Will need evaluation for home O2 needs prior to discharge.  Subjective: Still with wheezing, audible. SOB.  He would stay one more night./    Assessment & Plan:  Acute respiratory failure with hypoxia due to COPD exacerbation -Continue with IV solumedrol, change to TID.  -Nebulizer Tx.  - States that he quit smoking 1.5 weeks prior to this admission. -Not ready to be discharge today.  -Continue with Azithromycin.   Acute kidney injury Resolved. DC IV fluids. Increased ad lib. oral liquids and diet.  Leukocytosis;  Suspect related to steroids.   Hx of arythmia > PPM /AICD Patient having intermittent NSVT. On 01/08/16, Requested [facility] to check device and consult if needed. Continue telemetry. Potassium and magnesium are appropriate.  HTN Blood pressure currently well controlled. On metoprolol. Lisinopril currently on hold.   HLD Continue usual home medical therapy  Macrocytosis Denies heavy alcohol abuse - check B-12 (247-low normal) and folic acid levels. May consider B12 supplements at discharge.  Thrombocytopenia - Appears chronic upon reviewing lab results from April 2016. Stable.  Tobacco abuse - Cessation counseled.   DVT prophylaxis: lovenox  Code Status: FULL CODE Family Communication: Spoke with wife at bedside Disposition Plan: Transferred from stepdown to telemetry 10/8. Possible DC home 10/11.  Consultants:   none  Procedures: none  Antimicrobials:  Azithromycin 10/7 >  Objective:  Vitals:   01/08/16 1426 01/08/16 1923 01/08/16 2043 01/09/16 0528  BP: 130/64  (!) 121/53 (!) 106/57  Pulse: 89  80 83  Resp: 20  18 20   Temp:   97.8 F (36.6 C) 97.4 F (36.3 C)  TempSrc:   Oral Oral  SpO2: 96% 94% 98% 98%  Weight:      Height:      Temperature 97.71F, respiratory rate 18/m   Intake/Output Summary (Last 24 hours) at 01/09/16 1014 Last data filed at 01/09/16 0500  Gross per 24 hour  Intake              480 ml  Output              975 ml  Net             -495 ml   Filed Weights   01/06/16 1351  Weight: 85.7 kg (189 lb)    Examination: General: Pleasant elderly male, sitting up in chair. Mild tachypnea with purse lipped breathing. Lungs: Reduced breath sounds bilaterally with prolonged expiration and scattered few medium pitched expiratory rhonchi but no crackles. Mildly tachypneic and worsening breathing. Cardiovascular: Regular rate and rhythm without murmur gallop or rub normal S1 and S2. No JVD or pedal edema. Telemetry: V paced rhythm. Noted up to 3 episodes of NSVT up to 13 bpm. Abdomen: Nontender, overweight, soft, bowel sounds positive, no rebound, no ascites, no appreciable mass Extremities: No significant cyanosis, clubbing, or edema bilateral lower extremities CNS: Alert and oriented. No focal neurological deficits.  CBC:  Recent Labs Lab 01/06/16 1355 01/06/16 1503 01/07/16 0235 01/08/16 0140 01/09/16 0201  WBC 16.3*  --  12.4* 20.5* 20.7*  HGB 14.8 16.0 14.7 14.8 13.7  HCT 45.5 47.0 46.1 45.9 44.3  MCV 103.9*  --  105.0* 103.6* 104.7*  PLT 144*  --  138* 148* 141*   Basic Metabolic Panel:  Recent Labs Lab 01/06/16 1355 01/06/16 1454 01/06/16 1503 01/07/16 0235 01/08/16 0140  NA 137 141 141 139 136  K 7.5* 4.0 3.9 4.1 4.3  CL 100* 102 99* 100* 97*  CO2 30 32  --  28 29  GLUCOSE 129* 134* 130* 151* 118*  BUN 16 15 18  21* 37*  CREATININE 1.11  1.17 1.10 1.32* 1.14  CALCIUM 8.8* 9.4  --  9.3 9.3  MG  --   --   --   --  2.1   GFR: Estimated Creatinine Clearance: 56.9 mL/min (by C-G formula based on SCr of 1.14 mg/dL).  Liver Function Tests: No results for input(s): AST, ALT, ALKPHOS, BILITOT, PROT, ALBUMIN in the last 168 hours. No results for input(s): LIPASE, AMYLASE in the last 168 hours. No results for input(s): AMMONIA in the last 168 hours.  Coagulation Profile: No results for input(s): INR, PROTIME in the last 168 hours.  Cardiac Enzymes: No results for input(s): CKTOTAL, CKMB, CKMBINDEX, TROPONINI in the last 168 hours.  HbA1C: Hgb A1c MFr Bld  Date/Time Value Ref Range Status  01/06/2016 07:04 PM 5.7 (H) 4.8 - 5.6 % Final    Comment:    (NOTE)         Pre-diabetes: 5.7 - 6.4         Diabetes: >6.4         Glycemic control for adults with diabetes: <7.0     CBG: No results for input(s): GLUCAP in the last 168 hours.  Recent Results (from the past 240 hour(s))  MRSA PCR Screening     Status: None   Collection Time: 01/06/16  7:43 PM  Result Value Ref Range Status   MRSA by PCR NEGATIVE NEGATIVE Final    Comment:        The GeneXpert MRSA Assay (FDA approved for NASAL specimens only), is one component of a comprehensive MRSA colonization surveillance program. It is not intended to diagnose MRSA infection nor to guide or monitor treatment for MRSA infections.      Scheduled Meds: . azithromycin  500 mg Oral Daily  . budesonide (PULMICORT) nebulizer solution  0.25 mg Nebulization BID  . enoxaparin (LOVENOX) injection  40 mg Subcutaneous Q24H  . ipratropium-albuterol  3 mL Nebulization QID  . methylPREDNISolone (SOLU-MEDROL) injection  60 mg Intravenous Q8H  . metoprolol succinate  100 mg Oral BID  . multivitamin with minerals   Oral Daily  . simvastatin  20 mg Oral QHS  . sodium chloride flush  3 mL Intravenous Q12H     LOS: 3 days   Alba Coryegalado, Shon Indelicato A, MD [facility] Pager  [phone]  If 7PM-7AM, please contact night-coverage [email or web address] Password [email or web address] 01/09/2016, 10:14 AM

## 2016-01-09 NOTE — Progress Notes (Signed)
PT Note - Late Entry for 01/08/16  Pt doing well with mobility and no further PT needed.  Ready for dc from PT standpoint. Pt with SpO2 of 89% at lowest point with ambulation.    01/08/16 1540  PT Visit Information  Last PT Received On 01/08/16  Assistance Needed +1  History of Present Illness Patient is a 81 yo male admitted 01/06/16 with worsening dyspnea and productive cough.  Patient with acute resp failure with hypoxia (O2 sats in low 80's on room air).  Patient on BiPAP, now weaned to 4L O2 via [place].   PMH:  COPD, pacemaker, tobacco use, HTN, HLD  Precautions  Precautions None  Restrictions  Weight Bearing Restrictions No  Pain Assessment  Pain Assessment No/denies pain  Cognition  Arousal/Alertness Awake/alert  Behavior During Therapy WFL for tasks assessed/performed  Overall Cognitive Status Within Functional Limits for tasks assessed  Bed Mobility  General bed mobility comments Pt up in recliner  Transfers  Overall transfer level Modified independent  Equipment used None  Transfers Sit to/from Stand  Sit to Stand Modified independent (Device/Increase time)  Ambulation/Gait  Ambulation/Gait assistance Modified independent (Device/Increase time)  Ambulation Distance (Feet) 250 Feet  Assistive device None (pushing IV pole)  Gait Pattern/deviations Step-through pattern;Decreased stride length  General Gait Details Steady gait with or without pushing IV pole. SpO2 89% on RA with ambulation.   Balance  Overall balance assessment Modified Independent  PT - End of Session  Activity Tolerance Patient tolerated treatment well  Patient left in chair;with call bell/phone within reach  Nurse Communication Mobility status  PT - Assessment/Plan  Follow Up Recommendations No PT follow up  PT equipment None recommended by PT  PT Goal Progression  Progress towards PT goals Goals met/education completed, patient discharged from PT  Acute Rehab PT Goals  PT Goal Formulation All  assessment and education complete, DC therapy  PT Time Calculation  PT Start Time (ACUTE ONLY) 1522  PT Stop Time (ACUTE ONLY) 1530  PT Time Calculation (min) (ACUTE ONLY) 8 min  PT General Charges  $$ ACUTE PT VISIT 1 Procedure  PT Treatments  $Gait Training 8-22 mins   [facility] PT [phone]

## 2016-01-09 NOTE — Care Management Note (Signed)
Case Management Note Donn Pierini RN, BSN Unit 2W-Case Manager [phone]  Patient Details  Name: Jaffer Dinsdale MRN: [id number] Date of Birth: 05-26-1934  Subjective/Objective:  Pt admitted with acute respiratory failure/COPD                  Action/Plan: PTA pt lived at home with SO- [facility] for [facility]- per conversation with pt at the bedside- pt has PCP at the [facility] in the [facility]- pt gets his meds via Mail order via the [place]. Pt can not remember his PCP-name- phone# to clinic is [phone]- as it is a federal holiday today- unable to reach the [place] to notify of pt's admission- will call the [facility] tomorrow to speak with them regarding pt's admission and any discharge needs- pt may need home 02 and would like this arranged through the [place] if needed. CM to continue to follow.    Expected Discharge Date:                  Expected Discharge Plan:  Home/Self Care  In-House Referral:     Discharge planning Services  CM Consult  Post Acute Care Choice:  Durable Medical Equipment Choice offered to:     DME Arranged:    DME Agency:     HH Arranged:    HH Agency:     Status of Service:  In process, will continue to follow  If discussed at Long Length of Stay Meetings, dates discussed:    Additional Comments:  01/09/16- 1000-  Donn Pierini RN, CM- call made to [facility]- spoke with April- pt not showing in [place] system- call then made to Pana at the [facility]- msg left that pt was here at [facility]- no return call as of 1600- regarding pt's PCP- pt at this time does not meet the qualifications for home 02- CM to continue to follow. Pt now showing as [employer].   Darrold Span, RN 01/09/2016, 3:59 PM

## 2016-01-09 NOTE — Progress Notes (Signed)
Physical Therapy Discharge Patient Details Name: James Le MRN: [id number] DOB: 12/13/1934 Today's Date: 01/09/2016 Time:  -     Patient discharged from PT services secondary to goals met and no further PT needs identified.  Please see latest therapy progress note for current level of functioning and progress toward goals.    Progress and discharge plan discussed with patient and/or caregiver: Patient/Caregiver agrees with plan  [facility] PT [phone]   GP     [facility] 01/09/2016, 8:39 AM

## 2016-01-10 ENCOUNTER — Encounter (HOSPITAL_COMMUNITY): Payer: Self-pay | Admitting: General Practice

## 2016-01-10 MED ORDER — MOMETASONE FURO-FORMOTEROL FUM 200-5 MCG/ACT IN AERO
2.0000 | INHALATION_SPRAY | Freq: Two times a day (BID) | RESPIRATORY_TRACT | Status: DC
  Administered 2016-01-10 – 2016-01-11 (×2): 2 via RESPIRATORY_TRACT
  Filled 2016-01-10: qty 8.8

## 2016-01-10 MED ORDER — AZITHROMYCIN 500 MG PO TABS: 500.0000 mg | ORAL_TABLET | Freq: Every day | ORAL | 0 refills | Status: DC

## 2016-01-10 MED ORDER — PREDNISONE 20 MG PO TABS: ORAL_TABLET | ORAL | 0 refills | Status: DC

## 2016-01-10 NOTE — Discharge Summary (Addendum)
Physician Discharge Summary  James Le ZOX:[id number] DOB: Apr 29, 1934 DOA: 01/06/2016  PCP: PROVIDER NOT IN SYSTEM  Admit date: 01/06/2016 Discharge date: 01/10/2016  Admitted From: Home  Disposition: Home   Discharge cancel 10-11 awaiting for oxygen to be set up.   Recommendations for Outpatient Follow-up:  1. Follow up with PCP in 1-2 weeks 2. Please obtain BMP/CBC in one week   Equipment/Devices: Home oxygen   Discharge Condition: stable.  CODE STATUS: Full code.  Diet recommendation: Heart Healthy   Brief/Interim Summary: 81 y.o.malewho gets his care in the [facility] system presented to our ED secondary to worsening dyspnea on exertion and productive sputum for 2-3 days. He has a history of COPD (not on O2).  Upon EMS arrival his sats were in the low 80s on room air. He claims to have quit smoking 1.5 weeks prior to this admission. History of HTN. Slowly improving. Possible discharge in the next 24 hours (patient may insist on leaving). Will need evaluation for home O2 needs prior to discharge.  Subjective: Feeling better, breathing at baseline. Wants to go home    Assessment & Plan:  Acute respiratory failure with hypoxia due toCOPD exacerbation -received IV solumedrol TID.  -Nebulizer Tx. Pulmicort.   - States that he quit smoking 1.5 weeks prior to this admission. -Continue with Azithromycin. 2 more days. Resume Symbicort at discharge  -He is feeling better, he wants to be discharge. He relates that he has chronic wheezing.  -Advised to return to hospital if Dyspnea get worse.   Acute kidney injury Resolved. DC IV fluids. Increased ad lib. oral liquids and diet.  Leukocytosis;  Suspect related to steroids.   Hx of arythmia > PPM /AICD Patient having intermittent NSVT. On 01/08/16, Requested [facility] to check device and consult if needed. Continue telemetry. Potassium and magnesium are appropriate.  HTN Blood pressure currently well controlled. On  metoprolol. Lisinopril currently on hold.   HLD Continue usual home medical therapy  Macrocytosis Denies heavy alcohol abuse - check B-12 (247-low normal) and folic acid levels. May consider B12 supplements at outpatient follow up/   Thrombocytopenia - Appears chronic upon reviewing lab results from April 2016. Stable.  Tobacco abuse - Cessation counseled.  Discharge Diagnoses:  Principal Problem:   Acute respiratory failure with hypoxia (HCC) Active Problems:   COPD exacerbation (HCC)   Pacemaker   Tobacco abuse   Paced rhythm on cardiac monitor   Hyperglycemia    Discharge Instructions  Discharge Instructions    Diet - low sodium heart healthy    Complete by:  As directed    Increase activity slowly    Complete by:  As directed        Medication List    TAKE these medications   albuterol (2.5 MG/3ML) 0.083% nebulizer solution Commonly known as:  PROVENTIL Take 2.5 mg by nebulization every 6 (six) hours as needed for wheezing or shortness of breath.   albuterol 108 (90 Base) MCG/ACT inhaler Commonly known as:  PROVENTIL HFA;VENTOLIN HFA Inhale 2 puffs into the lungs every 6 (six) hours as needed for wheezing.   azithromycin 500 MG tablet Commonly known as:  ZITHROMAX Take 1 tablet (500 mg total) by mouth daily. Start taking on:  01/11/2016   CENTRUM SILVER 50+MEN PO Take 1 tablet by mouth daily.   furosemide 40 MG tablet Commonly known as:  LASIX Take 40 mg by mouth daily.   ipratropium 0.02 % nebulizer solution Commonly known as:  ATROVENT Inhale 500  mcg into the lungs.   lisinopril 20 MG tablet Commonly known as:  PRINIVIL,ZESTRIL Take 5 mg by mouth daily.   metoprolol succinate 100 MG 24 hr tablet Commonly known as:  TOPROL-XL Take 100 mg by mouth 2 (two) times daily.   predniSONE 20 MG tablet Commonly known as:  DELTASONE Take 3 tablet by mouth daily for 3 days the 2 tablets for 4 days then one tablet for 2 days then go back to 5 mg  daily. What changed:  medication strength  how much to take  how to take this  when to take this  additional instructions   simvastatin 40 MG tablet Commonly known as:  ZOCOR Take 20 mg by mouth at bedtime.   SYMBICORT 160-4.5 MCG/ACT inhaler Generic drug:  budesonide-formoterol Inhale 2 puffs into the lungs 2 (two) times daily.       Allergies  Allergen Reactions  . Metoprolol Tartrate Rash    Consultations:  none   Procedures/Studies: Dg Chest Portable 1 View  Result Date: 01/06/2016 CLINICAL DATA:  Pt reports SOB onset last night that has been worse today; he reports h/o COPD and pacemaker placement (states he's had 4); former smoker EXAM: PORTABLE CHEST 1 VIEW COMPARISON:  None. FINDINGS: Left anterior chest wall biventricular cardioverter-defibrillator appears well positioned on this single AP view. No pneumothorax.  Lungs are clear.  No pleural effusion. Cardiac silhouette is normal in size. No mediastinal or hilar masses. IMPRESSION: 1. No acute cardiopulmonary disease. 2. Well-positioned biventricular cardioverter-defibrillator. Electronically Signed   By: Amie Portlandavid  Ormond M.D.   On: 01/06/2016 14:20      Discharge Exam: Vitals:   01/10/16 0450 01/10/16 0957  BP: 131/67 (!) 154/63  Pulse: 87 93  Resp: 18   Temp: 97.6 F (36.4 C)    Vitals:   01/10/16 0450 01/10/16 0719 01/10/16 0723 01/10/16 0957  BP: 131/67   (!) 154/63  Pulse: 87   93  Resp: 18     Temp: 97.6 F (36.4 C)     TempSrc: Oral     SpO2: 97% 95% 95% 97%  Weight:      Height:        General: Pt is alert, awake, not in acute distress Cardiovascular: RRR, S1/S2 +, no rubs, no gallops Respiratory: CTA bilaterally, bilateral sporadic  wheezing, no rhonchi Abdominal: Soft, NT, ND, bowel sounds + Extremities: no edema, no cyanosis    The results of significant diagnostics from this hospitalization (including imaging, microbiology, ancillary and laboratory) are listed below for  reference.     Microbiology: Recent Results (from the past 240 hour(s))  MRSA PCR Screening     Status: None   Collection Time: 01/06/16  7:43 PM  Result Value Ref Range Status   MRSA by PCR NEGATIVE NEGATIVE Final    Comment:        The GeneXpert MRSA Assay (FDA approved for NASAL specimens only), is one component of a comprehensive MRSA colonization surveillance program. It is not intended to diagnose MRSA infection nor to guide or monitor treatment for MRSA infections.      Labs: BNP (last 3 results)  Recent Labs  01/06/16 1355  BNP 233.6*   Basic Metabolic Panel:  Recent Labs Lab 01/06/16 1355 01/06/16 1454 01/06/16 1503 01/07/16 0235 01/08/16 0140  NA 137 141 141 139 136  K 7.5* 4.0 3.9 4.1 4.3  CL 100* 102 99* 100* 97*  CO2 30 32  --  28 29  GLUCOSE 129* 134*  130* 151* 118*  BUN 16 15 18  21* 37*  CREATININE 1.11 1.17 1.10 1.32* 1.14  CALCIUM 8.8* 9.4  --  9.3 9.3  MG  --   --   --   --  2.1   Liver Function Tests: No results for input(s): AST, ALT, ALKPHOS, BILITOT, PROT, ALBUMIN in the last 168 hours. No results for input(s): LIPASE, AMYLASE in the last 168 hours. No results for input(s): AMMONIA in the last 168 hours. CBC:  Recent Labs Lab 01/06/16 1355 01/06/16 1503 01/07/16 0235 01/08/16 0140 01/09/16 0201  WBC 16.3*  --  12.4* 20.5* 20.7*  HGB 14.8 16.0 14.7 14.8 13.7  HCT 45.5 47.0 46.1 45.9 44.3  MCV 103.9*  --  105.0* 103.6* 104.7*  PLT 144*  --  138* 148* 141*   Cardiac Enzymes: No results for input(s): CKTOTAL, CKMB, CKMBINDEX, TROPONINI in the last 168 hours. BNP: Invalid input(s): POCBNP CBG: No results for input(s): GLUCAP in the last 168 hours. D-Dimer No results for input(s): DDIMER in the last 72 hours. Hgb A1c No results for input(s): HGBA1C in the last 72 hours. Lipid Profile No results for input(s): CHOL, HDL, LDLCALC, TRIG, CHOLHDL, LDLDIRECT in the last 72 hours. Thyroid function studies No results for  input(s): TSH, T4TOTAL, T3FREE, THYROIDAB in the last 72 hours.  Invalid input(s): FREET3 Anemia work up  Recent Labs  01/08/16 0140  VITAMINB12 247  FOLATE 10.7   Urinalysis No results found for: COLORURINE, APPEARANCEUR, LABSPEC, PHURINE, GLUCOSEU, HGBUR, BILIRUBINUR, KETONESUR, PROTEINUR, UROBILINOGEN, NITRITE, LEUKOCYTESUR Sepsis Labs Invalid input(s): PROCALCITONIN,  WBC,  LACTICIDVEN Microbiology Recent Results (from the past 240 hour(s))  MRSA PCR Screening     Status: None   Collection Time: 01/06/16  7:43 PM  Result Value Ref Range Status   MRSA by PCR NEGATIVE NEGATIVE Final    Comment:        The GeneXpert MRSA Assay (FDA approved for NASAL specimens only), is one component of a comprehensive MRSA colonization surveillance program. It is not intended to diagnose MRSA infection nor to guide or monitor treatment for MRSA infections.      Time coordinating discharge: Over 30 minutes  SIGNED:   Alba Cory, MD  [facility] 01/10/2016, 12:08 PM Pager   If 7PM-7AM, please contact night-coverage [email or web address] Password [email or web address]

## 2016-01-10 NOTE — Progress Notes (Addendum)
16:40: Received call from [facility], MD who approved HH Oxygen for this pt. She approved Home O2 @ 2L via [place]. She also leads me to think I can arrange this through [facility]. [facility] rep, Vaughan Basta tells me this is not the case, as does J Amerson, CM. Will present choice to this pt. 1) [facility] on [facility] (1-2 days) ? [facility], or 2) use [employer] which may have co-pay. Will first discuss with Regal[place],  MD.  17:30: Spoke with pt and his wife at bedside who are not happy about staying overnight to wait on DME Oxygen but are very clear pt cannot go home without Oxygen at 2L. Spoke with Sunnie Nielsen, MD who is aware of all of the above. CM spoke with [facility] who can provide Oxygen 01/11/2016. Spoke with [facility], and [place] [facility] none of which can facilitate this process this pm. Will ensure delivery of DME Oxygen to this pt first thing in am so he can be discharged home with oxygen.

## 2016-01-10 NOTE — Progress Notes (Signed)
SATURATION QUALIFICATIONS: (This note is used to comply with regulatory documentation for home oxygen)  Patient Saturations on Room Air at Rest = 95%  Patient Saturations on Room Air while Ambulating = 87%  Patient Saturations on 2 Liters of oxygen while Ambulating =93%  Please briefly explain why patient needs home oxygen: Patient's oxygen levels drop to low on Room Air, he needs oxygen when ambulating.  Kathryne Hitch

## 2016-01-10 NOTE — Care Management Important Message (Signed)
Important Message  Patient Details  Name: James Le MRN: [id number] Date of Birth: 1934/12/06   Medicare Important Message Given:  Yes    Dorena Bodo 01/10/2016, 10:33 AM

## 2016-01-10 NOTE — Care Management Note (Signed)
Case Management Note  Patient Details  Name: James Le MRN: [id number] Date of Birth: 1934-08-14  Subjective/Objective:  Spoke with [facility] [place] who confirms pt is active pt there. [name], MD is his PCP and will be responsible for deciding 1) if he will need Oxygen as an outpt when discharged, and 2) if this CM will arrange prior to discharge or if [facility] Will make arrangements. Left call back number [phone] for Dr Prudencio Burly to make this writer aware of next step in this process. Faxed MD order, face sheet and Oxygen saturations which show Need for Home O2 to [phone] as directed. ATTN: [name], MD                   Action/Plan: Await call from Prudencio Burly, MD for direction. Made pt aware of all of above.    Expected Discharge Date:                  Expected Discharge Plan:  Home/Self Care  In-House Referral:     Discharge planning Services  CM Consult  Post Acute Care Choice:  Durable Medical Equipment Choice offered to:     DME Arranged:    DME Agency:     HH Arranged:    HH Agency:     Status of Service:  In process, will continue to follow  If discussed at Long Length of Stay Meetings, dates discussed:    Additional Comments:  Yvone Neu, RN 01/10/2016, 2:59 PM

## 2016-01-11 NOTE — Care Management Note (Signed)
Case Management Note  Patient Details  Name: James Le MRN: [id number] Date of Birth: [birth year]  Subjective/Objective:  Pt patiently awaiting delivery of DME.                   Action/Plan: Discharge   Expected Discharge Date:                  Expected Discharge Plan:  Home/Self Care  In-House Referral:     Discharge planning Services  CM Consult  Post Acute Care Choice:  Durable Medical Equipment Choice offered to:  Patient  DME Arranged:  Oxygen DME Agency:  [facility]  HH Arranged:    HH Agency:     Status of Service:  Completed, signed off  If discussed at Long Length of Stay Meetings, dates discussed: 01/11/2016   Additional Comments:  Yvone Neu, RN 01/11/2016, 9:46 AM

## 2016-01-11 NOTE — Discharge Instructions (Signed)
[facility]  Follow up with PCP in 1 week.

## 2016-01-11 NOTE — Discharge Summary (Signed)
Physician Discharge Summary  James Le James Le WUJ:[id number] DOB: September 13, 1934 DOA: 01/06/2016  PCP: PROVIDER NOT IN SYSTEM  Admit date: 01/06/2016 Discharge date: 01/11/2016  Admitted From: Home  Disposition: Home   Discharge cancel 10-11 awaiting for oxygen to be set up.   Recommendations for Outpatient Follow-up:  1. Follow up with PCP in 1-2 weeks 2. Please obtain BMP/CBC in one week   Equipment/Devices: Home oxygen   Discharge Condition: stable.  CODE STATUS: Full code.  Diet recommendation: Heart Healthy   Brief/Interim Summary: 81 y.o.malewho [facility] presented to our ED secondary to worsening dyspnea on exertion and productive sputum for 2-3 days. He has a history of COPD (not on O2).  Upon EMS arrival his sats were in the low 80s on room air. He claims to have quit smoking 1.5 weeks prior to this admission. History of HTN. Slowly improving. Possible discharge in the next 24 hours (patient may insist on leaving). Will need evaluation for home O2 needs prior to discharge.  Subjective: Feeling better, breathing at baseline. Wants to go home    Assessment & Plan:  Acute respiratory failure with hypoxia due toCOPD exacerbation -received IV solumedrol TID.  -Nebulizer Tx. Pulmicort.   - States that he quit smoking 1.5 weeks prior to this admission. -Continue with Azithromycin. 2 more days. Resume Symbicort at discharge  -He is feeling better, he wants to be discharge. He relates that he has chronic wheezing.  -Advised to return to hospital if Dyspnea get worse.  Hopefully home oxygen will be set up today.  Stable to be discharge   Acute kidney injury Resolved. DC IV fluids. Increased ad lib. oral liquids and diet.  Leukocytosis;  Suspect related to steroids.   Hx of arythmia > PPM /AICD Patient having intermittent NSVT. On 01/08/16, Requested [facility] to check device and consult if needed. Continue telemetry. Potassium and magnesium  are appropriate.  HTN Blood pressure currently well controlled. On metoprolol. Lisinopril currently on hold.   HLD Continue usual home medical therapy  Macrocytosis Denies heavy alcohol abuse - check B-12 (247-low normal) and folic acid levels. May consider B12 supplements at outpatient follow up/   Thrombocytopenia - Appears chronic upon reviewing lab results from April 2016. Stable.  Tobacco abuse - Cessation counseled.  Discharge Diagnoses:  Principal Problem:   Acute respiratory failure with hypoxia (HCC) Active Problems:   COPD exacerbation (HCC)   Pacemaker   Tobacco abuse   Paced rhythm on cardiac monitor   Hyperglycemia    Discharge Instructions  Discharge Instructions    Diet - low sodium heart healthy    Complete by:  As directed    Increase activity slowly    Complete by:  As directed        Medication List    TAKE these medications   albuterol (2.5 MG/3ML) 0.083% nebulizer solution Commonly known as:  PROVENTIL Take 2.5 mg by nebulization every 6 (six) hours as needed for wheezing or shortness of breath.   albuterol 108 (90 Base) MCG/ACT inhaler Commonly known as:  PROVENTIL HFA;VENTOLIN HFA Inhale 2 puffs into the lungs every 6 (six) hours as needed for wheezing.   azithromycin 500 MG tablet Commonly known as:  ZITHROMAX Take 1 tablet (500 mg total) by mouth daily.   CENTRUM SILVER 50+MEN PO Take 1 tablet by mouth daily.   furosemide 40 MG tablet Commonly known as:  LASIX Take 40 mg by mouth daily.   ipratropium 0.02 %  nebulizer solution Commonly known as:  ATROVENT Inhale 500 mcg into the lungs.   lisinopril 20 MG tablet Commonly known as:  PRINIVIL,ZESTRIL Take 5 mg by mouth daily.   metoprolol succinate 100 MG 24 hr tablet Commonly known as:  TOPROL-XL Take 100 mg by mouth 2 (two) times daily.   predniSONE 20 MG tablet Commonly known as:  DELTASONE Take 3 tablet by mouth daily for 3 days the 2 tablets for 4 days then one  tablet for 2 days then go back to 5 mg daily. What changed:  medication strength  how much to take  how to take this  when to take this  additional instructions Notes to patient:  Take as directed   simvastatin 40 MG tablet Commonly known as:  ZOCOR Take 20 mg by mouth at bedtime.   SYMBICORT 160-4.5 MCG/ACT inhaler Generic drug:  budesonide-formoterol Inhale 2 puffs into the lungs 2 (two) times daily.       Allergies  Allergen Reactions  . Metoprolol Tartrate Rash    Consultations:  none   Procedures/Studies: Dg Chest Portable 1 View  Result Date: 01/06/2016 CLINICAL DATA:  Pt reports SOB onset last night that has been worse today; he reports h/o COPD and pacemaker placement (states he's had 4); former smoker EXAM: PORTABLE CHEST 1 VIEW COMPARISON:  None. FINDINGS: Left anterior chest wall biventricular cardioverter-defibrillator appears well positioned on this single AP view. No pneumothorax.  Lungs are clear.  No pleural effusion. Cardiac silhouette is normal in size. No mediastinal or hilar masses. IMPRESSION: 1. No acute cardiopulmonary disease. 2. Well-positioned biventricular cardioverter-defibrillator. Electronically Signed   By: Amie Portland M.D.   On: 01/06/2016 14:20     Discharge Exam: Vitals:   01/10/16 1946 01/11/16 0426  BP: (!) 102/47 126/75  Pulse: 65 80  Resp: 20 20  Temp: 98.1 F (36.7 C) 97.9 F (36.6 C)   Vitals:   01/10/16 1946 01/10/16 1951 01/11/16 0426 01/11/16 0805  BP: (!) 102/47  126/75   Pulse: 65  80   Resp: 20  20   Temp: 98.1 F (36.7 C)  97.9 F (36.6 C)   TempSrc: Oral  Oral   SpO2: 99% 99% 98% 97%  Weight:      Height:        General: Pt is alert, awake, not in acute distress Cardiovascular: RRR, S1/S2 +, no rubs, no gallops Respiratory: CTA bilaterally, bilateral sporadic  wheezing, no rhonchi Abdominal: Soft, NT, ND, bowel sounds + Extremities: no edema, no cyanosis    The results of significant  diagnostics from this hospitalization (including imaging, microbiology, ancillary and laboratory) are listed below for reference.     Microbiology: Recent Results (from the past 240 hour(s))  MRSA PCR Screening     Status: None   Collection Time: 01/06/16  7:43 PM  Result Value Ref Range Status   MRSA by PCR NEGATIVE NEGATIVE Final    Comment:        The GeneXpert MRSA Assay (FDA approved for NASAL specimens only), is one component of a comprehensive MRSA colonization surveillance program. It is not intended to diagnose MRSA infection nor to guide or monitor treatment for MRSA infections.      Labs: BNP (last 3 results)  Recent Labs  01/06/16 1355  BNP 233.6*   Basic Metabolic Panel:  Recent Labs Lab 01/06/16 1355 01/06/16 1454 01/06/16 1503 01/07/16 0235 01/08/16 0140  NA 137 141 141 139 136  K 7.5* 4.0 3.9 4.1  4.3  CL 100* 102 99* 100* 97*  CO2 30 32  --  28 29  GLUCOSE 129* 134* 130* 151* 118*  BUN 16 15 18  21* 37*  CREATININE 1.11 1.17 1.10 1.32* 1.14  CALCIUM 8.8* 9.4  --  9.3 9.3  MG  --   --   --   --  2.1   Liver Function Tests: No results for input(s): AST, ALT, ALKPHOS, BILITOT, PROT, ALBUMIN in the last 168 hours. No results for input(s): LIPASE, AMYLASE in the last 168 hours. No results for input(s): AMMONIA in the last 168 hours. CBC:  Recent Labs Lab 01/06/16 1355 01/06/16 1503 01/07/16 0235 01/08/16 0140 01/09/16 0201  WBC 16.3*  --  12.4* 20.5* 20.7*  HGB 14.8 16.0 14.7 14.8 13.7  HCT 45.5 47.0 46.1 45.9 44.3  MCV 103.9*  --  105.0* 103.6* 104.7*  PLT 144*  --  138* 148* 141*   Cardiac Enzymes: No results for input(s): CKTOTAL, CKMB, CKMBINDEX, TROPONINI in the last 168 hours. BNP: Invalid input(s): POCBNP CBG: No results for input(s): GLUCAP in the last 168 hours. D-Dimer No results for input(s): DDIMER in the last 72 hours. Hgb A1c No results for input(s): HGBA1C in the last 72 hours. Lipid Profile No results for  input(s): CHOL, HDL, LDLCALC, TRIG, CHOLHDL, LDLDIRECT in the last 72 hours. Thyroid function studies No results for input(s): TSH, T4TOTAL, T3FREE, THYROIDAB in the last 72 hours.  Invalid input(s): FREET3 Anemia work up No results for input(s): VITAMINB12, FOLATE, FERRITIN, TIBC, IRON, RETICCTPCT in the last 72 hours. Urinalysis No results found for: COLORURINE, APPEARANCEUR, LABSPEC, PHURINE, GLUCOSEU, HGBUR, BILIRUBINUR, KETONESUR, PROTEINUR, UROBILINOGEN, NITRITE, LEUKOCYTESUR Sepsis Labs Invalid input(s): PROCALCITONIN,  WBC,  LACTICIDVEN Microbiology Recent Results (from the past 240 hour(s))  MRSA PCR Screening     Status: None   Collection Time: 01/06/16  7:43 PM  Result Value Ref Range Status   MRSA by PCR NEGATIVE NEGATIVE Final    Comment:        The GeneXpert MRSA Assay (FDA approved for NASAL specimens only), is one component of a comprehensive MRSA colonization surveillance program. It is not intended to diagnose MRSA infection nor to guide or monitor treatment for MRSA infections.      Time coordinating discharge: Over 30 minutes  SIGNED:   Alba Cory, MD  [facility] 01/11/2016, 9:42 AM Pager   If 7PM-7AM, please contact night-coverage [email or web address] Password [email or web address]

## 2016-01-11 NOTE — Progress Notes (Signed)
Order received to discharge patient. Patient expresses readiness to discharge.  Telemetry monitor was removed and CCMD notified.  PIV was removed without complication.  Discharge instructions, follow up, medications and instructions for their use were discussed with patient and wife and patient voiced understanding.

## 2016-01-11 NOTE — Progress Notes (Signed)
08:44: James Le with [facility] to ensure he had received text message sent at 0800 for delivery of Oxygen to this patient. It is to be delivered ASAP. No further CM needs at this time.

## 2017-04-15 ENCOUNTER — Encounter: Payer: Self-pay | Admitting: Podiatry

## 2017-04-15 ENCOUNTER — Ambulatory Visit (INDEPENDENT_AMBULATORY_CARE_PROVIDER_SITE_OTHER): Payer: Medicare Other | Admitting: Podiatry

## 2017-04-15 DIAGNOSIS — M79676 Pain in unspecified toe(s): Secondary | ICD-10-CM

## 2017-04-15 DIAGNOSIS — F17200 Nicotine dependence, unspecified, uncomplicated: Secondary | ICD-10-CM | POA: Insufficient documentation

## 2017-04-15 DIAGNOSIS — I10 Essential (primary) hypertension: Secondary | ICD-10-CM | POA: Insufficient documentation

## 2017-04-15 DIAGNOSIS — R7303 Prediabetes: Secondary | ICD-10-CM | POA: Insufficient documentation

## 2017-04-15 DIAGNOSIS — E7800 Pure hypercholesterolemia, unspecified: Secondary | ICD-10-CM | POA: Insufficient documentation

## 2017-04-15 DIAGNOSIS — I428 Other cardiomyopathies: Secondary | ICD-10-CM | POA: Insufficient documentation

## 2017-04-15 DIAGNOSIS — B351 Tinea unguium: Secondary | ICD-10-CM

## 2017-04-15 NOTE — Progress Notes (Signed)
   Subjective:    Patient ID: James Carina., male    DOB: January 14, 1935, 83 y.o.   MRN: [id number]  HPI    Review of Systems  All other systems reviewed and are negative.      Objective:   Physical Exam        Assessment & Plan:

## 2017-04-17 NOTE — Progress Notes (Signed)
   SUBJECTIVE Patient presents to office today complaining of elongated, thickened nails. Pain while ambulating in shoes. Patient is unable to trim their own nails.   Past Medical History:  Diagnosis Date  . COPD (chronic obstructive pulmonary disease) (HCC)   . Dyspnea   . Presence of permanent cardiac pacemaker     OBJECTIVE General Patient is awake, alert, and oriented x 3 and in no acute distress. Derm Skin is dry and supple bilateral. Negative open lesions or macerations. Remaining integument unremarkable. Nails are tender, long, thickened and dystrophic with subungual debris, consistent with onychomycosis, 1-5 bilateral. No signs of infection noted. Vasc  DP and PT pedal pulses palpable bilaterally. Temperature gradient within normal limits.  Neuro Epicritic and protective threshold sensation diminished bilaterally.  Musculoskeletal Exam No symptomatic pedal deformities noted bilateral. Muscular strength within normal limits.  ASSESSMENT 1. Onychodystrophic nails 1-5 bilateral with hyperkeratosis of nails.  2. Onychomycosis of nail due to dermatophyte bilateral 3. Pain in foot bilateral  PLAN OF CARE 1. Patient evaluated today.  2. Instructed to maintain good pedal hygiene and foot care.  3. Mechanical debridement of nails 1-5 bilaterally performed using a nail nipper. Filed with dremel without incident.  4. Return to clinic in 3 mos.    Felecia Shelling, DPM [facility]  Dr. Felecia Shelling, DPM    [address]                                        [place], [place] [address]                Office [phone]  Fax [phone]

## 2017-07-15 ENCOUNTER — Encounter: Payer: Self-pay | Admitting: Podiatry

## 2017-07-15 ENCOUNTER — Ambulatory Visit (INDEPENDENT_AMBULATORY_CARE_PROVIDER_SITE_OTHER): Payer: Medicare Other | Admitting: Podiatry

## 2017-07-15 DIAGNOSIS — M79676 Pain in unspecified toe(s): Secondary | ICD-10-CM

## 2017-07-15 DIAGNOSIS — B351 Tinea unguium: Secondary | ICD-10-CM | POA: Diagnosis not present

## 2017-07-22 NOTE — Progress Notes (Signed)
   SUBJECTIVE Patient presents to office today complaining of elongated, thickened nails that cause pain while ambulating in shoes. He is unable to trim his own nails. Patient is here for further evaluation and treatment.  Past Medical History:  Diagnosis Date  . COPD (chronic obstructive pulmonary disease) (HCC)   . Dyspnea   . Presence of permanent cardiac pacemaker     OBJECTIVE General Patient is awake, alert, and oriented x 3 and in no acute distress. Derm Skin is dry and supple bilateral. Negative open lesions or macerations. Remaining integument unremarkable. Nails are tender, long, thickened and dystrophic with subungual debris, consistent with onychomycosis, 1-5 bilateral. No signs of infection noted. Vasc  DP and PT pedal pulses palpable bilaterally. Temperature gradient within normal limits.  Neuro Epicritic and protective threshold sensation grossly intact bilaterally.  Musculoskeletal Exam No symptomatic pedal deformities noted bilateral. Muscular strength within normal limits.  ASSESSMENT 1. Onychodystrophic nails 1-5 bilateral with hyperkeratosis of nails.  2. Onychomycosis of nail due to dermatophyte bilateral 3. Pain in foot bilateral  PLAN OF CARE 1. Patient evaluated today.  2. Instructed to maintain good pedal hygiene and foot care.  3. Mechanical debridement of nails 1-5 bilaterally performed using a nail nipper. Filed with dremel without incident.  4. Return to clinic in 3 mos.    Brent M. Evans, DPM [facility]  Dr. Brent M. Evans, DPM    [address]                                        [place], [place] [address]                Office [phone]  Fax [phone]     

## 2017-07-28 ENCOUNTER — Other Ambulatory Visit: Payer: Self-pay

## 2017-07-28 ENCOUNTER — Emergency Department: Payer: Medicare Other

## 2017-07-28 ENCOUNTER — Emergency Department
Admission: EM | Admit: 2017-07-28 | Discharge: 2017-07-28 | Disposition: A | Discharge status: Other Acute Inpatient Hospital | Payer: Medicare Other | Attending: Emergency Medicine | Admitting: Emergency Medicine

## 2017-07-28 DIAGNOSIS — J441 Chronic obstructive pulmonary disease with (acute) exacerbation: Secondary | ICD-10-CM

## 2017-07-28 DIAGNOSIS — R0602 Shortness of breath: Secondary | ICD-10-CM | POA: Diagnosis present

## 2017-07-28 DIAGNOSIS — I509 Heart failure, unspecified: Secondary | ICD-10-CM | POA: Diagnosis not present

## 2017-07-28 DIAGNOSIS — Z79899 Other long term (current) drug therapy: Secondary | ICD-10-CM | POA: Diagnosis not present

## 2017-07-28 DIAGNOSIS — Z9911 Dependence on respirator [ventilator] status: Secondary | ICD-10-CM | POA: Diagnosis not present

## 2017-07-28 DIAGNOSIS — L03115 Cellulitis of right lower limb: Secondary | ICD-10-CM

## 2017-07-28 DIAGNOSIS — J449 Chronic obstructive pulmonary disease, unspecified: Secondary | ICD-10-CM | POA: Diagnosis not present

## 2017-07-28 LAB — COMPREHENSIVE METABOLIC PANEL
ALT: 29 U/L (ref 17–63)
ANION GAP: 10 (ref 5–15)
AST: 27 U/L (ref 15–41)
Albumin: 3.4 g/dL — ABNORMAL LOW (ref 3.5–5.0)
Alkaline Phosphatase: 81 U/L (ref 38–126)
BUN: 26 mg/dL — ABNORMAL HIGH (ref 6–20)
CHLORIDE: 101 mmol/L (ref 101–111)
CO2: 29 mmol/L (ref 22–32)
Calcium: 8.8 mg/dL — ABNORMAL LOW (ref 8.9–10.3)
Creatinine, Ser: 1.3 mg/dL — ABNORMAL HIGH (ref 0.61–1.24)
GFR, EST AFRICAN AMERICAN: 58 mL/min — AB (ref >60)
GFR, EST NON AFRICAN AMERICAN: 50 mL/min — AB (ref >60)
Glucose, Bld: 117 mg/dL — ABNORMAL HIGH (ref 65–99)
Potassium: 3.6 mmol/L (ref 3.5–5.1)
Sodium: 140 mmol/L (ref 135–145)
Total Bilirubin: 1.5 mg/dL — ABNORMAL HIGH (ref 0.3–1.2)
Total Protein: 6.8 g/dL (ref 6.5–8.1)

## 2017-07-28 LAB — CBC
HCT: 40.5 % (ref 40.0–52.0)
HEMOGLOBIN: 13.8 g/dL (ref 13.0–18.0)
MCH: 34.5 pg — ABNORMAL HIGH (ref 26.0–34.0)
MCHC: 34.1 g/dL (ref 32.0–36.0)
MCV: 101 fL — ABNORMAL HIGH (ref 80.0–100.0)
PLATELETS: 186 10*3/uL (ref 150–440)
RBC: 4.01 MIL/uL — ABNORMAL LOW (ref 4.40–5.90)
RDW: 15.3 % — AB (ref 11.5–14.5)
WBC: 16.6 10*3/uL — ABNORMAL HIGH (ref 3.8–10.6)

## 2017-07-28 LAB — BRAIN NATRIURETIC PEPTIDE: B NATRIURETIC PEPTIDE 5: 585 pg/mL — AB (ref 0.0–100.0)

## 2017-07-28 LAB — TROPONIN I: TROPONIN I: 0.12 ng/mL — AB (ref <0.03)

## 2017-07-28 MED ORDER — IPRATROPIUM-ALBUTEROL 0.5-2.5 (3) MG/3ML IN SOLN
3.0000 mL | Freq: Once | RESPIRATORY_TRACT | Status: DC
Start: 2017-07-28 — End: 2017-07-28

## 2017-07-28 MED ORDER — ALBUTEROL SULFATE (2.5 MG/3ML) 0.083% IN NEBU
5.0000 mg | INHALATION_SOLUTION | Freq: Once | RESPIRATORY_TRACT | Status: AC
  Administered 2017-07-28: 5 mg via RESPIRATORY_TRACT
  Filled 2017-07-28: qty 6

## 2017-07-28 MED ORDER — FUROSEMIDE 10 MG/ML IJ SOLN
20.0000 mg | Freq: Once | INTRAMUSCULAR | Status: AC
  Administered 2017-07-28: 20 mg via INTRAVENOUS
  Filled 2017-07-28: qty 4

## 2017-07-28 MED ORDER — METHYLPREDNISOLONE SODIUM SUCC 125 MG IJ SOLR
125.0000 mg | Freq: Once | INTRAMUSCULAR | Status: AC
  Administered 2017-07-28: 125 mg via INTRAVENOUS
  Filled 2017-07-28: qty 2

## 2017-07-28 MED ORDER — DOXYCYCLINE HYCLATE 100 MG PO TABS
100.0000 mg | ORAL_TABLET | Freq: Once | ORAL | Status: AC
  Administered 2017-07-28: 100 mg via ORAL
  Filled 2017-07-28: qty 1

## 2017-07-28 MED ORDER — IPRATROPIUM-ALBUTEROL 0.5-2.5 (3) MG/3ML IN SOLN
9.0000 mL | Freq: Once | RESPIRATORY_TRACT | Status: AC
Start: 2017-07-28 — End: 2017-07-28
  Administered 2017-07-28: 9 mL via RESPIRATORY_TRACT

## 2017-07-28 NOTE — ED Triage Notes (Addendum)
Hx of copd, pt was diagnosed 3 weeks ago with pneumonia, pt started having difficulty breathing this am and states that nothing is helping, pt is labored to breathe in triage with wheezing and coughing noted. Pt also has some crackles noted with his cough and swelling to bilat lower extremities, worse in the rt

## 2017-07-28 NOTE — ED Notes (Signed)
Pt to US at this time.

## 2017-07-28 NOTE — ED Notes (Signed)
EMTALA reviewed by charge RN 

## 2017-07-28 NOTE — ED Notes (Signed)
Report called to [place], RN at [facility] [phone] ext [phone].

## 2017-07-28 NOTE — ED Provider Notes (Addendum)
[facility] Emergency Department Provider Note  ____________________________________________   First MD Initiated Contact with Patient 07/28/17 [phone]     (approximate)  I have reviewed the triage vital signs and the nursing notes.   HISTORY  Chief Complaint Shortness of Breath   HPI James Le is a 83 y.o. male with a history of CHF as well as COPD who is presenting with worsening shortness of breath over the past 2 to 3 weeks after being discharged from the [facility] hospital with pneumonia.  He has not reporting any fever.  Is not reporting any pain.  Says that he has been having worsening shortness of breath with associated right lower extremity edema.  Says that his left lower extremity is the extremity that usually swells more than the right.  Patient takes Eliquis for unknown reason.  Does not report history of any blood clots.  Not reporting any chest pain at this time.  Placed on nasal cannula oxygen by triage nurse for increased work of breathing.  No past medical history on file.  There are no active problems to display for this patient.     Prior to Admission medications   Medication Sig Start Date End Date Taking? Authorizing Provider  albuterol (PROVENTIL HFA;VENTOLIN HFA) 108 (90 Base) MCG/ACT inhaler Inhale 1-2 puffs into the lungs every 6 (six) hours as needed for wheezing or shortness of breath.   Yes [provider]  apixaban (ELIQUIS) 2.5 MG TABS tablet Take 2.5 mg by mouth 2 (two) times daily.   Yes [provider]  budesonide-formoterol (SYMBICORT) 160-4.5 MCG/ACT inhaler Inhale 2 puffs into the lungs 2 (two) times daily.   Yes [provider]  cholecalciferol (VITAMIN D) 400 units TABS tablet Take 400 Units by mouth daily.   Yes [provider]  colchicine 0.6 MG tablet Take 0.6 mg by mouth daily as needed (gout flare).   Yes [provider]  furosemide (LASIX) 80 MG tablet Take 80 mg by mouth  every morning.   Yes [provider]  lisinopril (PRINIVIL,ZESTRIL) 2.5 MG tablet Take 2.5 mg by mouth daily.   Yes [provider]  metoprolol tartrate (LOPRESSOR) 100 MG tablet Take 100 mg by mouth 2 (two) times daily.   Yes [provider]  Multiple Vitamin (MULTIVITAMIN WITH MINERALS) TABS tablet Take 1 tablet by mouth daily.   Yes [provider]  predniSONE (DELTASONE) 5 MG tablet Take 5 mg by mouth daily with breakfast.   Yes [provider]    Allergies Patient has no known allergies.  No family history on file.  Social History Social History   Tobacco Use  . Smoking status: Not on file  Substance Use Topics  . Alcohol use: Not on file  . Drug use: Not on file    Review of Systems  Constitutional: No fever/chills Eyes: No visual changes. ENT: No sore throat. Cardiovascular: Denies chest pain. Respiratory: As above Gastrointestinal: No abdominal pain.  No nausea, no vomiting.  No diarrhea.  No constipation. Genitourinary: Negative for dysuria. Musculoskeletal: Negative for back pain. Skin: Negative for rash. Neurological: Negative for headaches, focal weakness or numbness.   ____________________________________________   PHYSICAL EXAM:  VITAL SIGNS: ED Triage Vitals  Enc Vitals Group     BP 07/28/17 0031 (!) 107/56     Pulse Rate 07/28/17 0031 79     Resp 07/28/17 0031 (!) 32     Temp 07/28/17 0031 98.4 F (36.9 C)  Temp Source 07/28/17 0031 Oral     SpO2 07/28/17 0031 92 %     Weight 07/28/17 0032 170 lb (77.1 kg)     Height 07/28/17 0032 6' (1.829 m)     Head Circumference --      Peak Flow --      Pain Score 07/28/17 0044 8     Pain Loc --      Pain Edu? --      Excl. in GC? --     Constitutional: Alert and oriented.  Speaking in full sentences but with labored respirations. Eyes: Conjunctivae are normal.  Head: Atraumatic. Nose: No congestion/rhinnorhea. Mouth/Throat: Mucous membranes are  moist.  Neck: No stridor.   Cardiovascular: Normal rate, regular rhythm. Grossly normal heart sounds.   Respiratory: Labored respirations with use of accessory muscles.  Coarse wheezing throughout with a prolonged expiratory phase.  Tachypneic.   Gastrointestinal: Soft and nontender. No distention.  Musculoskeletal: Minimal left lower externally edema from ankle to the proximal calf.  Moderate right lower extremity edema from the foot to the proximal calf. Neurologic:  Normal speech and language. No gross focal neurologic deficits are appreciated. Skin:  Skin is warm, dry and intact. No rash noted. Psychiatric: Mood and affect are normal. Speech and behavior are normal.  ____________________________________________   LABS (all labs ordered are listed, but only abnormal results are displayed)  Labs Reviewed  CBC - Abnormal; Notable for the following components:      Result Value   WBC 16.6 (*)    RBC 4.01 (*)    MCV 101.0 (*)    MCH 34.5 (*)    RDW 15.3 (*)    All other components within normal limits  TROPONIN I - Abnormal; Notable for the following components:   Troponin I 0.12 (*)    All other components within normal limits  BRAIN NATRIURETIC PEPTIDE - Abnormal; Notable for the following components:   B Natriuretic Peptide 585.0 (*)    All other components within normal limits  COMPREHENSIVE METABOLIC PANEL - Abnormal; Notable for the following components:   Glucose, Bld 117 (*)    BUN 26 (*)    Creatinine, Ser 1.30 (*)    Calcium 8.8 (*)    Albumin 3.4 (*)    Total Bilirubin 1.5 (*)    GFR calc non Af Amer 50 (*)    GFR calc Af Amer 58 (*)    All other components within normal limits   ____________________________________________  EKG  ED ECG REPORT I, Arelia Longest, the attending physician, personally viewed and interpreted this ECG.   Date: 07/28/2017  EKG Time: 0028  Rate: 89  Rhythm: Paced rhythm with occasional supraventricular complexes  Axis:  Normal  Intervals:Wide-complex consistent with ventricular pacing  ST&T Change: No ST segment elevation or depression.  Single T wave inversion in V2.  No previous EKGs for comparison.  ____________________________________________  RADIOLOGY  COPD ____________________________________________   PROCEDURES  Procedure(s) performed:   Procedures  Critical Care performed:   ____________________________________________   INITIAL IMPRESSION / ASSESSMENT AND PLAN / ED COURSE  Pertinent labs & imaging results that were available during my care of the patient were reviewed by me and considered in my medical decision making (see chart for details).  Differential includes, but is not limited to, viral syndrome, bronchitis including COPD exacerbation, pneumonia, reactive airway disease including asthma, CHF including exacerbation with or without pulmonary/interstitial edema, pneumothorax, ACS, thoracic trauma, and pulmonary embolism. As part of my  medical decision making, I reviewed the following data within the electronic MEDICAL RECORD NUMBERNo previous records on file for review  ----------------------------------------- 2:51 AM on 07/28/2017 -----------------------------------------  Patient at this time despite nebs and steroids is still wheezing with a prolonged expiratory phase and labored respirations.  Ultrasound negative.  Possible cellulitis.  Will treat COPD exacerbation as well as cellulitis with doxycycline.  Plan on transfer to the [facility].  Patient aware of diagnosis and treatment plan and need for transfer and understanding and willing to comply.  ----------------------------------------- 5:19 AM on 07/28/2017 -----------------------------------------  Discussed case with Dr. Clemens Catholic from the [facility] who except the patient has needed to ED transfer. ____________________________________________   FINAL CLINICAL IMPRESSION(S) / ED DIAGNOSES  Final diagnoses:   Shortness of breath   COPD exacerbation.  Cellulitis.   NEW MEDICATIONS STARTED DURING THIS VISIT:  New Prescriptions   No medications on file     Note:  This document was prepared using Dragon voice recognition software and may include unintentional dictation errors.     Myrna Blazer, MD 07/28/17 1610    Myrna Blazer, MD 07/28/17 [phone]

## 2017-10-29 ENCOUNTER — Ambulatory Visit: Payer: Medicare Other | Admitting: Internal Medicine

## 2017-11-14 ENCOUNTER — Encounter: Payer: Self-pay | Admitting: Podiatry

## 2017-11-14 ENCOUNTER — Ambulatory Visit (INDEPENDENT_AMBULATORY_CARE_PROVIDER_SITE_OTHER): Payer: Medicare Other | Admitting: Podiatry

## 2017-11-14 DIAGNOSIS — M79676 Pain in unspecified toe(s): Secondary | ICD-10-CM

## 2017-11-14 DIAGNOSIS — B351 Tinea unguium: Secondary | ICD-10-CM

## 2017-11-14 NOTE — Progress Notes (Signed)
   SUBJECTIVE Patient presents to office today complaining of elongated, thickened nails that cause pain while ambulating in shoes. He is unable to trim his own nails. Patient is here for further evaluation and treatment.  Past Medical History:  Diagnosis Date  . COPD (chronic obstructive pulmonary disease) (HCC)   . Dyspnea   . Presence of permanent cardiac pacemaker     OBJECTIVE General Patient is awake, alert, and oriented x 3 and in no acute distress. Derm Skin is dry and supple bilateral. Negative open lesions or macerations. Remaining integument unremarkable. Nails are tender, long, thickened and dystrophic with subungual debris, consistent with onychomycosis, 1-5 bilateral. No signs of infection noted. Vasc  DP and PT pedal pulses palpable bilaterally. Temperature gradient within normal limits.  Neuro Epicritic and protective threshold sensation grossly intact bilaterally.  Musculoskeletal Exam No symptomatic pedal deformities noted bilateral. Muscular strength within normal limits.  ASSESSMENT 1. Onychodystrophic nails 1-5 bilateral with hyperkeratosis of nails.  2. Onychomycosis of nail due to dermatophyte bilateral 3. Pain in foot bilateral  PLAN OF CARE 1. Patient evaluated today.  2. Instructed to maintain good pedal hygiene and foot care.  3. Mechanical debridement of nails 1-5 bilaterally performed using a nail nipper. Filed with dremel without incident.  4. Return to clinic in 3 mos.    Felecia Shelling, DPM [facility]  Dr. Felecia Shelling, DPM    [address]                                        [place], [place] [address]                Office [phone]  Fax [phone]

## 2018-01-27 ENCOUNTER — Ambulatory Visit: Payer: Medicare Other | Admitting: Podiatry

## 2018-04-01 DEATH — deceased

## 2018-04-14 ENCOUNTER — Encounter

## 2018-04-14 ENCOUNTER — Ambulatory Visit: Payer: Non-veteran care | Admitting: Internal Medicine

## 2018-04-14 DIAGNOSIS — Z0289 Encounter for other administrative examinations: Secondary | ICD-10-CM

## 2018-05-15 IMAGING — CR DG CHEST 1V PORT
1 series · 1 of 1 positions shown · non-contrast
Comparison: None.

CLINICAL DATA: Pt reports SOB onset last night that has been worse
today; he reports h/o COPD and pacemaker placement (states he's had
4); former smoker

EXAM:
PORTABLE CHEST 1 VIEW

[AP]
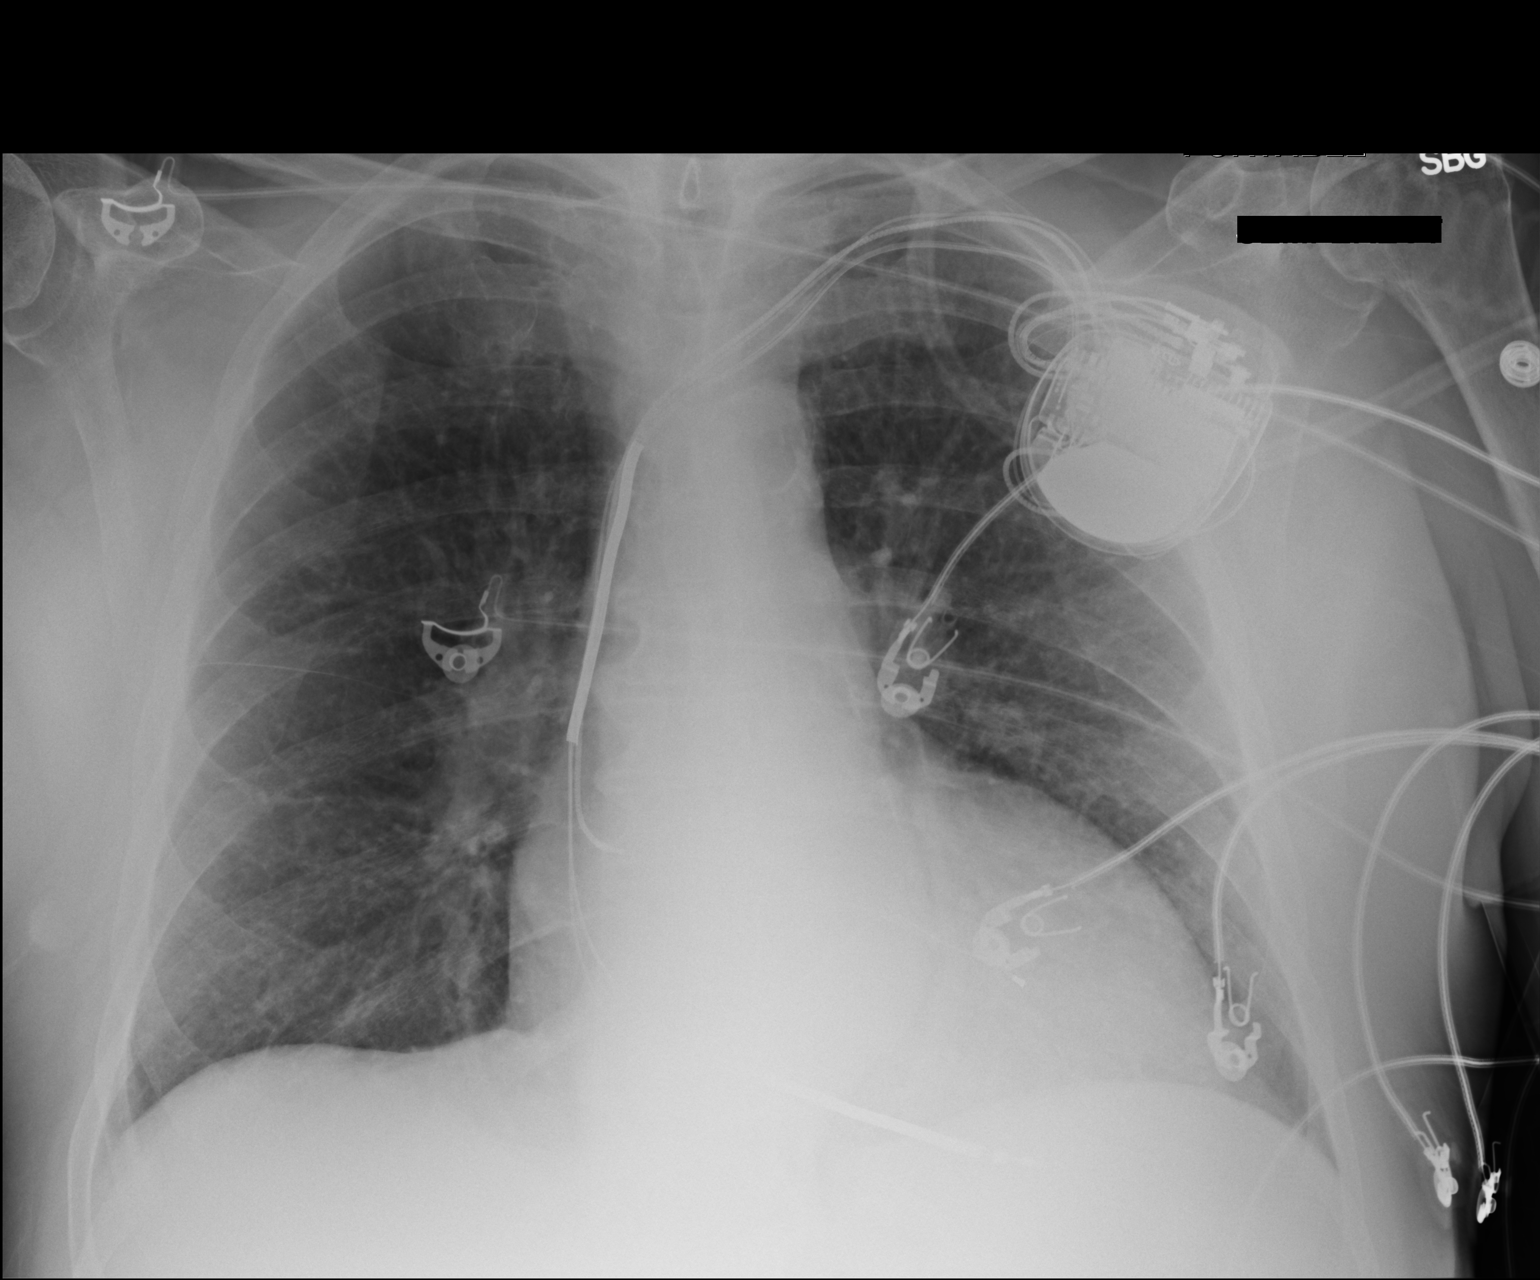

[1 of 1 positions shown; findings below may reference images not displayed]

FINDINGS: Left anterior chest wall biventricular cardioverter-defibrillator
appears well positioned on this single AP view.

No pneumothorax.  Lungs are clear.  No pleural effusion.

Cardiac silhouette is normal in size. No mediastinal or hilar
masses.
IMPRESSION: 1. No acute cardiopulmonary disease.
2. Well-positioned biventricular cardioverter-defibrillator.

## 2019-07-28 IMAGING — US US EXTREM LOW VENOUS*R*
1 series · 13 of 24 positions shown · non-contrast
Comparison: None.

CLINICAL DATA: Right lower extremity swelling



[Series 1: us extrem low venous*right* · 13 of 32 slices shown]
[im 1/32]
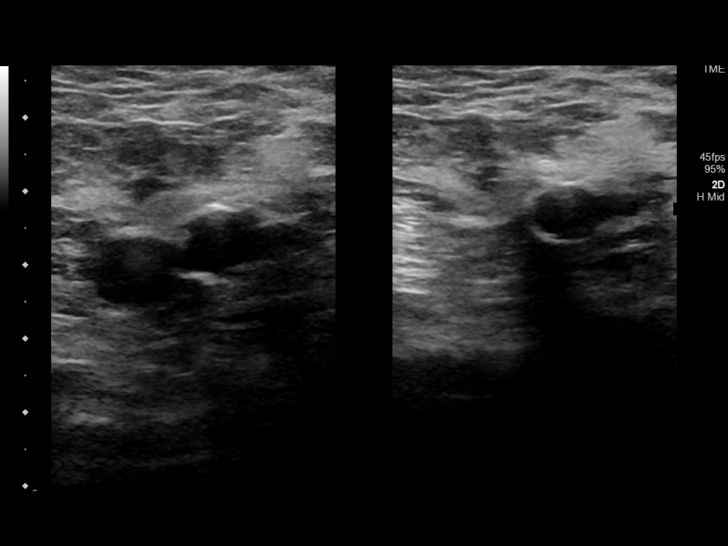
[im 3/32]
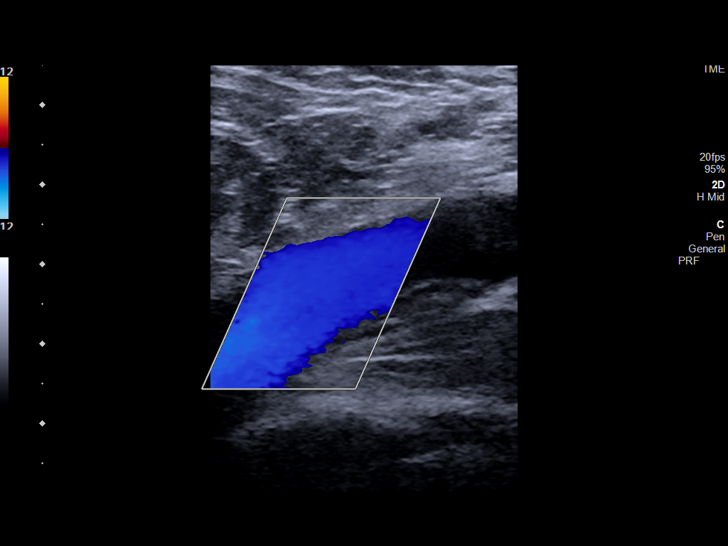
[im 6/32]
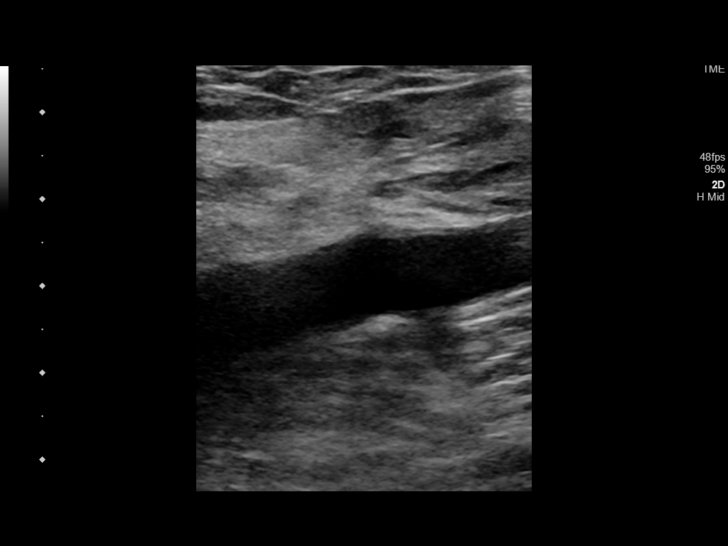
[im 9/32]
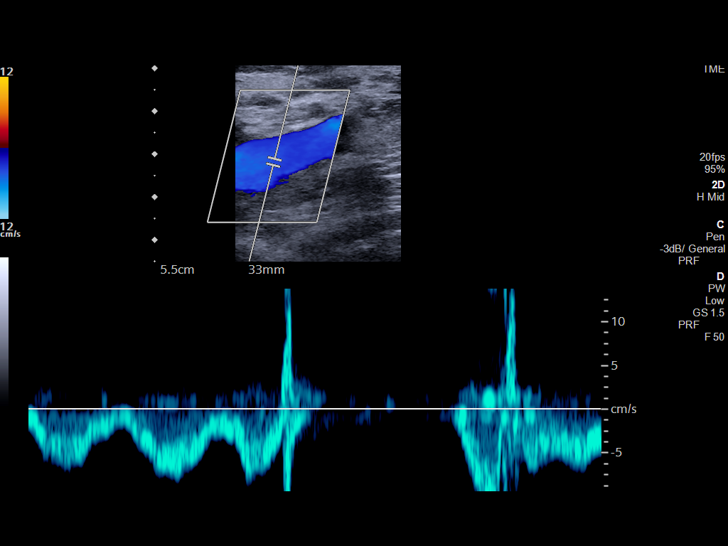
[im 11/32]
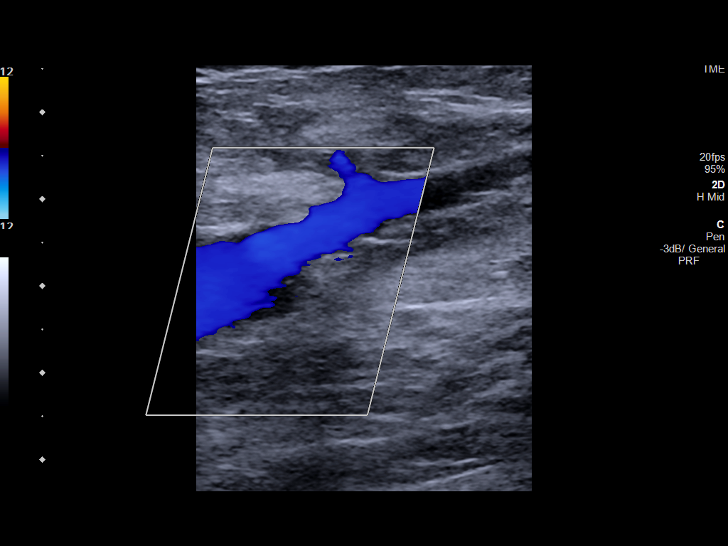
[im 14/32]
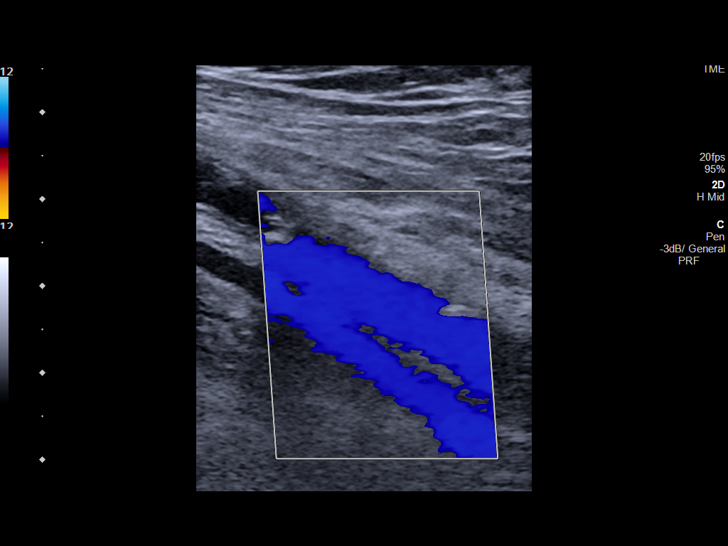
[im 17/32]
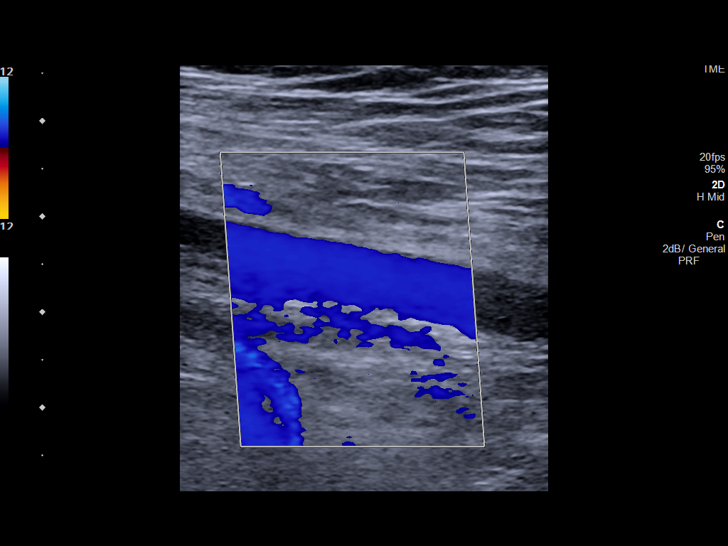
[im 18/32]
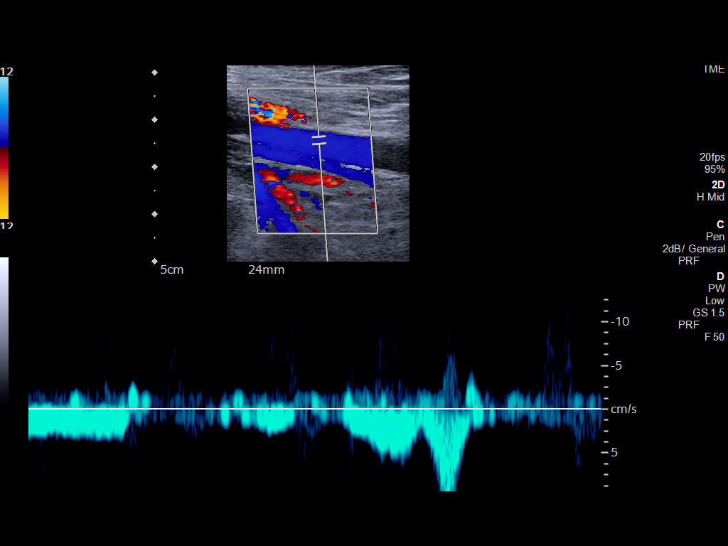
[im 21/32]
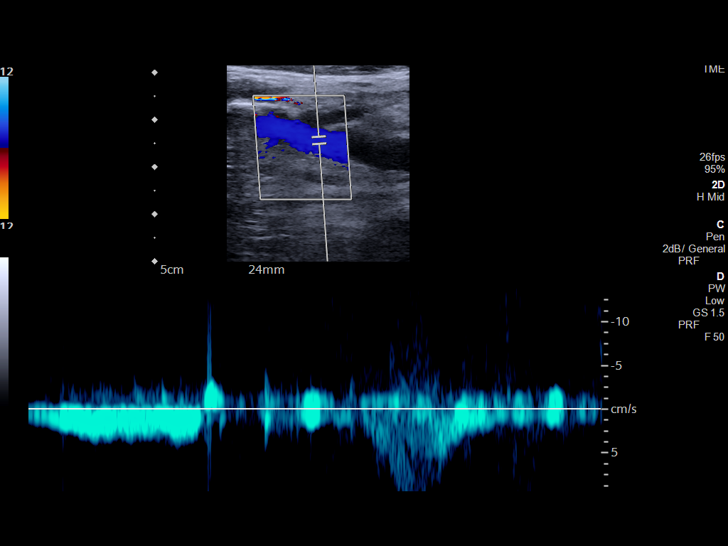
[im 23/32]
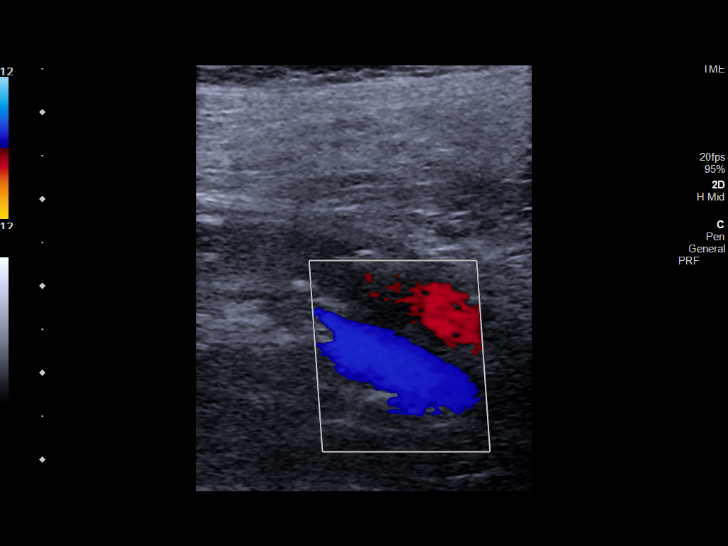
[im 26/32]
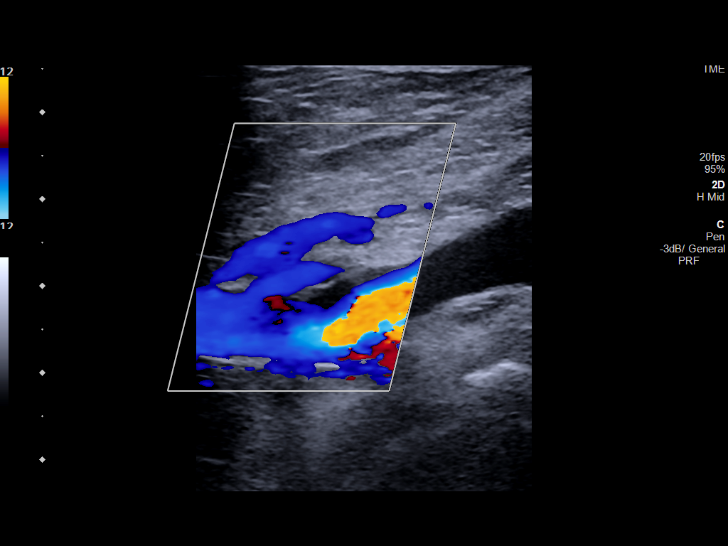
[im 29/32]
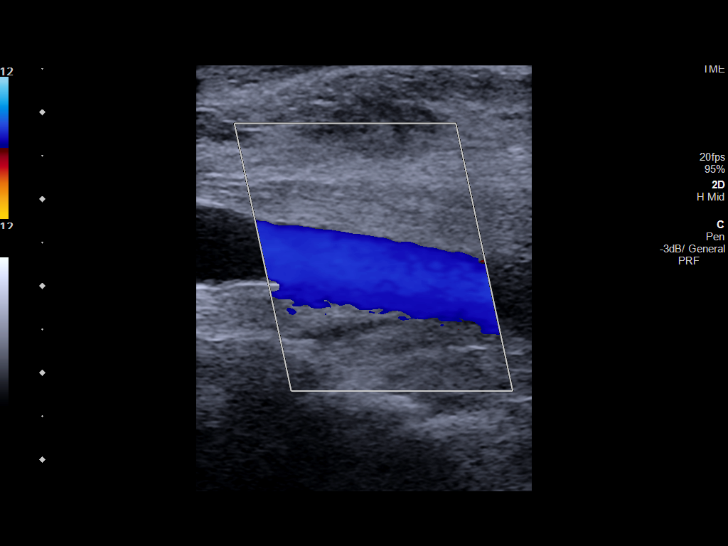
[im 32/32]
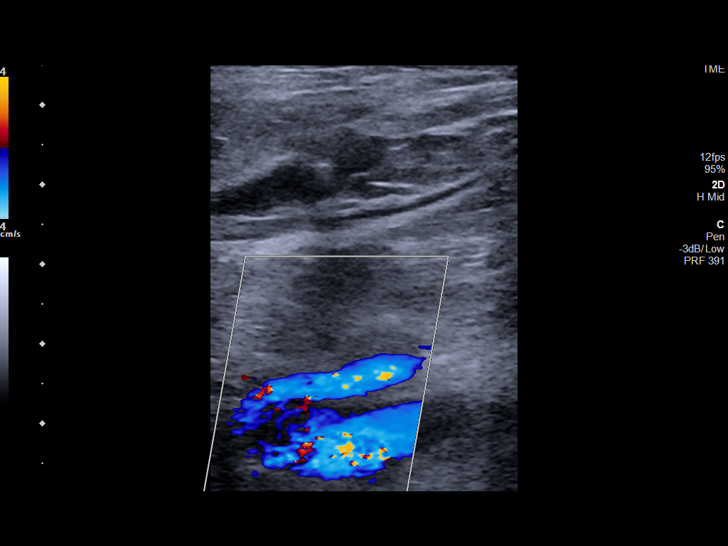

[13 of 24 positions shown; findings below may reference images not displayed]

FINDINGS: Contralateral Common Femoral Vein: Respiratory phasicity is normal
and symmetric with the symptomatic side. No evidence of thrombus.
Normal compressibility.

Common Femoral Vein: No evidence of thrombus. Normal
compressibility, respiratory phasicity and response to augmentation.

Saphenofemoral Junction: No evidence of thrombus. Normal
compressibility and flow on color Doppler imaging.

Profunda Femoral Vein: No evidence of thrombus. Normal
compressibility and flow on color Doppler imaging.

Femoral Vein: No evidence of thrombus. Normal compressibility,
respiratory phasicity and response to augmentation.

Popliteal Vein: No evidence of thrombus. Normal compressibility,
respiratory phasicity and response to augmentation.

Calf Veins: No evidence of thrombus. The peroneal veins were not
well visualized. Posterior tibial veins are patent. Normal
compressibility and flow on color Doppler imaging.

Superficial Great Saphenous Vein: No evidence of thrombus. Normal
compressibility.

Venous Reflux:  None.

Other Findings:  None.
IMPRESSION: No evidence of deep venous thrombosis.

## 2019-07-28 IMAGING — DX DG CHEST 1V PORT
1 series · 2 of 2 positions shown · non-contrast
Comparison: 01/06/2016

CLINICAL DATA: COPD with dyspnea this morning.

EXAM:
PORTABLE CHEST 1 VIEW

[Series 1: chest ap · 0.14mm/px · 2 of 2 slices shown]
[im 1/2]
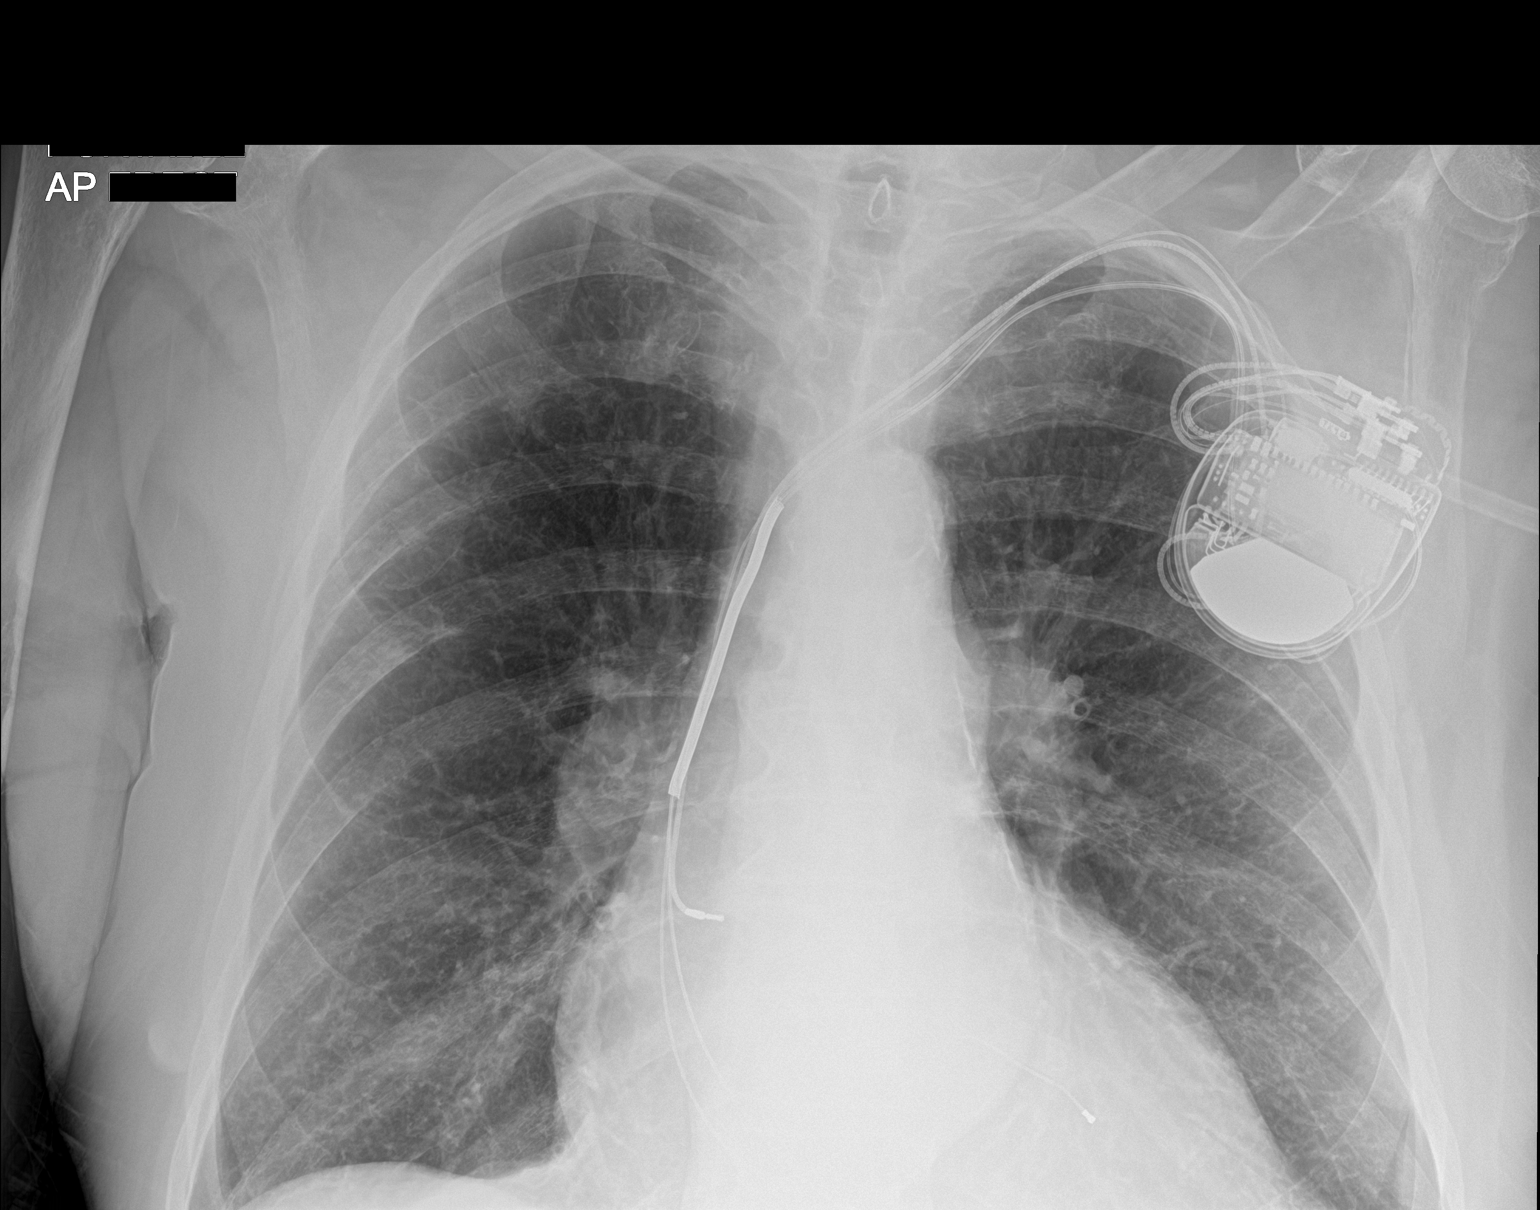
[im 2/2]
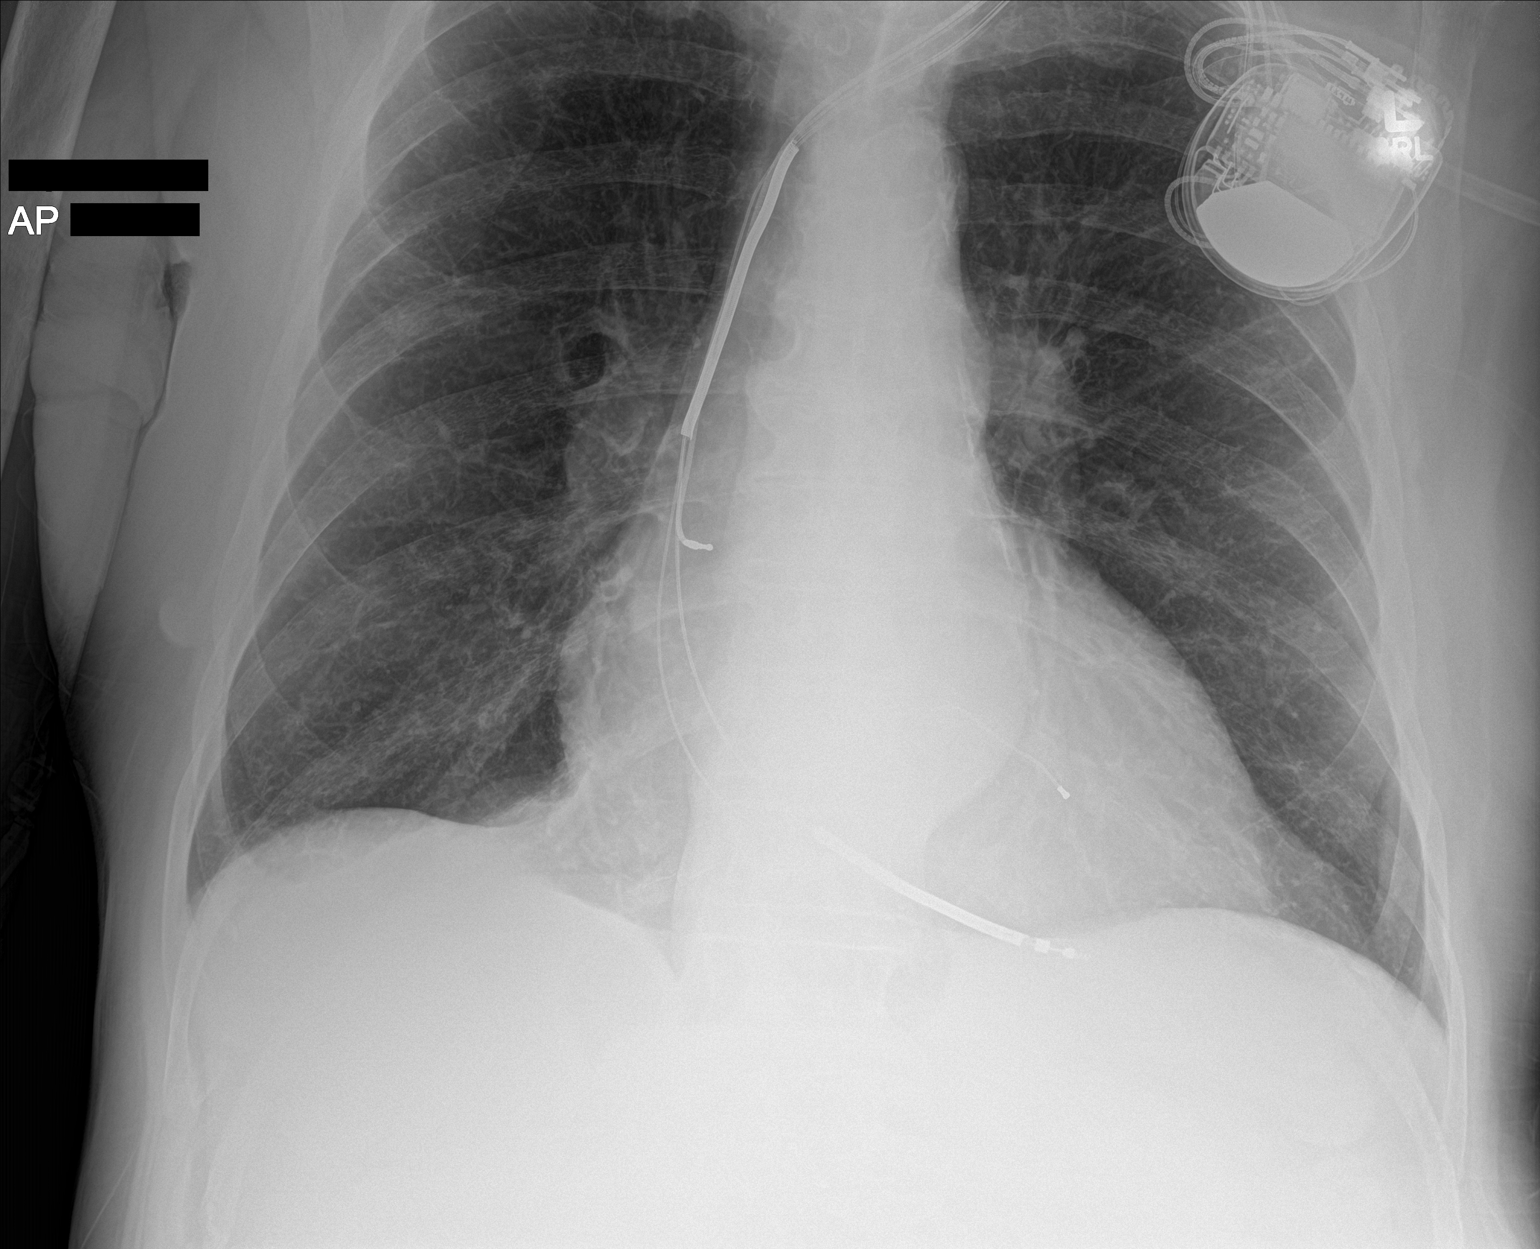

[2 of 2 positions shown; findings below may reference images not displayed]

FINDINGS: Stable cardiomegaly with aortic atherosclerosis. ICD device projects
over the left axilla with leads in the right atrium, coronary sinus
and right ventricle. Emphysematous hyperinflation of the lungs
consistent with COPD with subpleural scarring in the periphery of
the right upper lobe. No pulmonary consolidation, effusion or
pneumothorax. No pulmonary edema. No acute nor suspicious osseous
abnormality.
IMPRESSION: Emphysematous hyperinflation of the lungs. No acute pulmonary
consolidation or CHF. Aortic atherosclerosis with cardiomegaly,
stable in appearance.
